# Patient Record
Sex: Female | Born: 1935 | Race: White | Hispanic: No | Smoking: Never smoker
Health system: Southern US, Community
[De-identification: ages and names within clinical notes are randomized; demographics above are authoritative.]

## PROBLEM LIST (undated history)

## (undated) HISTORY — PX: CLUB FOOT RELEASE: SHX1363

## (undated) HISTORY — PX: EYE SURGERY: SHX253

---

## 2015-02-24 ENCOUNTER — Encounter: Payer: Self-pay | Admitting: Student

## 2015-02-24 ENCOUNTER — Other Ambulatory Visit: Payer: Self-pay

## 2015-02-24 ENCOUNTER — Emergency Department
Admission: EM | Admit: 2015-02-24 | Discharge: 2015-02-24 | Disposition: A | Discharge status: Home / Self Care | Payer: Medicare Other | Attending: Emergency Medicine | Admitting: Emergency Medicine

## 2015-02-24 ENCOUNTER — Emergency Department: Payer: Medicare Other

## 2015-02-24 DIAGNOSIS — Y92009 Unspecified place in unspecified non-institutional (private) residence as the place of occurrence of the external cause: Secondary | ICD-10-CM | POA: Insufficient documentation

## 2015-02-24 DIAGNOSIS — S51802A Unspecified open wound of left forearm, initial encounter: Secondary | ICD-10-CM | POA: Insufficient documentation

## 2015-02-24 DIAGNOSIS — S0511XA Contusion of eyeball and orbital tissues, right eye, initial encounter: Secondary | ICD-10-CM | POA: Insufficient documentation

## 2015-02-24 DIAGNOSIS — Y998 Other external cause status: Secondary | ICD-10-CM | POA: Insufficient documentation

## 2015-02-24 DIAGNOSIS — R42 Dizziness and giddiness: Secondary | ICD-10-CM | POA: Diagnosis not present

## 2015-02-24 DIAGNOSIS — M21961 Unspecified acquired deformity of right lower leg: Secondary | ICD-10-CM | POA: Diagnosis not present

## 2015-02-24 DIAGNOSIS — Z23 Encounter for immunization: Secondary | ICD-10-CM | POA: Insufficient documentation

## 2015-02-24 DIAGNOSIS — W19XXXA Unspecified fall, initial encounter: Secondary | ICD-10-CM

## 2015-02-24 DIAGNOSIS — S51812A Laceration without foreign body of left forearm, initial encounter: Secondary | ICD-10-CM

## 2015-02-24 DIAGNOSIS — Y9389 Activity, other specified: Secondary | ICD-10-CM | POA: Diagnosis not present

## 2015-02-24 DIAGNOSIS — S8012XD Contusion of left lower leg, subsequent encounter: Secondary | ICD-10-CM | POA: Insufficient documentation

## 2015-02-24 DIAGNOSIS — W1839XA Other fall on same level, initial encounter: Secondary | ICD-10-CM | POA: Insufficient documentation

## 2015-02-24 DIAGNOSIS — R7989 Other specified abnormal findings of blood chemistry: Secondary | ICD-10-CM | POA: Insufficient documentation

## 2015-02-24 DIAGNOSIS — R778 Other specified abnormalities of plasma proteins: Secondary | ICD-10-CM

## 2015-02-24 DIAGNOSIS — M21962 Unspecified acquired deformity of left lower leg: Secondary | ICD-10-CM | POA: Diagnosis not present

## 2015-02-24 LAB — COMPREHENSIVE METABOLIC PANEL
ALBUMIN: 3.4 g/dL — AB (ref 3.5–5.0)
ALK PHOS: 105 U/L (ref 38–126)
ALT: 14 U/L (ref 14–54)
AST: 25 U/L (ref 15–41)
Anion gap: 7 (ref 5–15)
BUN: 16 mg/dL (ref 6–20)
CO2: 28 mmol/L (ref 22–32)
Calcium: 8.4 mg/dL — ABNORMAL LOW (ref 8.9–10.3)
Chloride: 105 mmol/L (ref 101–111)
Creatinine, Ser: 0.5 mg/dL (ref 0.44–1.00)
GFR calc non Af Amer: >60 mL/min (ref >60)
GLUCOSE: 96 mg/dL (ref 65–99)
Potassium: 5.1 mmol/L (ref 3.5–5.1)
Sodium: 140 mmol/L (ref 135–145)
TOTAL PROTEIN: 6.5 g/dL (ref 6.5–8.1)
Total Bilirubin: 0.8 mg/dL (ref 0.3–1.2)

## 2015-02-24 LAB — CBC
HCT: 39.1 % (ref 36.0–46.0)
Hemoglobin: 14.1 g/dL (ref 12.0–16.0)
MCH: 38.4 pg — AB (ref 26.0–34.0)
MCHC: 36.1 g/dL — AB (ref 32.0–36.0)
MCV: 106.6 fL — ABNORMAL HIGH (ref 80.0–100.0)
PLATELETS: 158 10*3/uL (ref 150–440)
RBC: 3.67 MIL/uL — ABNORMAL LOW (ref 3.80–5.20)
RDW: 14.4 % (ref 11.5–14.5)
WBC: 12.3 10*3/uL — AB (ref 3.6–11.0)

## 2015-02-24 LAB — TROPONIN I
TROPONIN I: 0.05 ng/mL — AB (ref <0.031)
TROPONIN I: 0.03 ng/mL (ref <0.031)

## 2015-02-24 MED ORDER — MUPIROCIN CALCIUM 2 % EX CREA: 1.0000 "application " | TOPICAL_CREAM | Freq: Two times a day (BID) | CUTANEOUS | Status: DC

## 2015-02-24 MED ORDER — TETANUS-DIPHTH-ACELL PERTUSSIS 5-2.5-18.5 LF-MCG/0.5 IM SUSP
0.5000 mL | Freq: Once | INTRAMUSCULAR | Status: AC
  Administered 2015-02-24: 0.5 mL via INTRAMUSCULAR
  Filled 2015-02-24 (×2): qty 0.5

## 2015-02-24 NOTE — ED Provider Notes (Signed)
[facility] Emergency Department Provider Note     Time seen: ----------------------------------------- 7:54 AM on 02/24/2015 -----------------------------------------    I have reviewed the triage vital signs and the nursing notes.   HISTORY  Chief Complaint Fall    HPI Haley Waters is a 81 y.o. female who presents the ER brought in by EMS after falling this morning at home. Patient states she got out of bed and felt dizzy and fell. She was going to go to the bathroom, has a skin tear noted to the left forearm bandaged by EMS. She has no abrasion noted to the right periorbital area and the bridge of the nose. Patient denies any complaints other than the forearm burning. Patient states she feels fine currently   No past medical history on file.  There are no active problems to display for this patient.   No past surgical history on file.  Allergies Review of patient's allergies indicates not on file.  Social History History  Substance Use Topics  . Smoking status: Not on file  . Smokeless tobacco: Not on file  . Alcohol Use: Not on file    Review of Systems Constitutional: Negative for fever. Eyes: Negative for visual changes. ENT: Negative for sore throat. Cardiovascular: Negative for chest pain. Respiratory: Negative for shortness of breath. Gastrointestinal: Negative for abdominal pain, vomiting and diarrhea. Genitourinary: Negative for dysuria. Musculoskeletal: Negative for back pain. Skin: Negative for rash. Neurological: Negative for headaches, focal weakness or numbness. Positive for dizziness  10-point ROS otherwise negative.  ____________________________________________   PHYSICAL EXAM:  VITAL SIGNS: ED Triage Vitals  Enc Vitals Group     BP --      Pulse --      Resp --      Temp --      Temp src --      SpO2 --      Weight --      Height --      Head Cir --      Peak Flow --      Pain Score --      Pain  Loc --      Pain Edu? --      Excl. in GC? --     Constitutional: Alert and oriented. Well appearing and in no distress. Eyes: Conjunctivae are normal. PERRL. Normal extraocular movements. ENT   Head: There is a right infraorbital contusion.   Nose: Mild abrasion across the nasal bridge.   Mouth/Throat: Mucous membranes are moist.   Neck: No stridor. Cardiovascular: Normal rate, regular rhythm. Normal and symmetric distal pulses are present in all extremities. No murmurs, rubs, or gallops. Respiratory: Normal respiratory effort without tachypnea nor retractions. Breath sounds are clear and equal bilaterally. No wheezes/rales/rhonchi. Gastrointestinal: Soft and nontender. No distention. No abdominal bruits.  Musculoskeletal: There are deformities noted in bilateral lower extremities, there is contusion over the distal tibia which she states is old. No joint effusions.  No lower extremity tenderness nor edema. Neurologic:  Normal speech and language. No gross focal neurologic deficits are appreciated. Speech is normal. No gait instability. Skin:  Skin is warm, dry and intact. No rash noted. Psychiatric: Mood and affect are normal. Speech and behavior are normal. Patient exhibits appropriate insight and judgment. ____________________________________________  EKG: Interpreted by me. Sinus tachycardia with a rate of 109 bpm, low voltage QRS, normal PR interval, normal QRS with, normal QT interval. Left axis deviation  ____________________________________________  ED COURSE:  Pertinent labs &  imaging results that were available during my care of the patient were reviewed by me and considered in my medical decision making (see chart for details). We'll check basic labs and reevaluate. ____________________________________________    LABS (pertinent positives/negatives)  Labs Reviewed  CBC - Abnormal; Notable for the following:    WBC 12.3 (*)    RBC 3.67 (*)    MCV 106.6 (*)     MCH 38.4 (*)    MCHC 36.1 (*)    All other components within normal limits  TROPONIN I - Abnormal; Notable for the following:    Troponin I 0.05 (*)    All other components within normal limits  COMPREHENSIVE METABOLIC PANEL - Abnormal; Notable for the following:    Calcium 8.4 (*)    Albumin 3.4 (*)    All other components within normal limits  TROPONIN I    RADIOLOGY  IMPRESSION: No acute findings. Atrophy present as well as advanced small vessel disease.   ____________________________________________  FINAL ASSESSMENT AND PLAN  Fall, dizziness, elevated troponin  Plan: Patient with labs and imaging as dictated above. Repeat troponin is unremarkable. I've advised family of this, she does several reasons for troponin elevation. This can be rechecked by her doctor. She is in no acute distress, wants to go home and eat lunch. She is stable for discharge.   Emily Filbert, MD   Emily Filbert, MD 02/24/15 [phone]

## 2015-02-24 NOTE — Discharge Instructions (Signed)
Dizziness °Dizziness is a common problem. It is a feeling of unsteadiness or light-headedness. You may feel like you are about to faint. Dizziness can lead to injury if you stumble or fall. A person of any age group can suffer from dizziness, but dizziness is more common in older adults. °CAUSES  °Dizziness can be caused by many different things, including: °· Middle ear problems. °· Standing for too long. °· Infections. °· An allergic reaction. °· Aging. °· An emotional response to something, such as the sight of blood. °· Side effects of medicines. °· Tiredness. °· Problems with circulation or blood pressure. °· Excessive use of alcohol or medicines, or illegal drug use. °· Breathing too fast (hyperventilation). °· An irregular heart rhythm (arrhythmia). °· A low red blood cell count (anemia). °· Pregnancy. °· Vomiting, diarrhea, fever, or other illnesses that cause body fluid loss (dehydration). °· Diseases or conditions such as Parkinson's disease, high blood pressure (hypertension), diabetes, and thyroid problems. °· Exposure to extreme heat. °DIAGNOSIS  °Your health care provider will ask about your symptoms, perform a physical exam, and perform an electrocardiogram (ECG) to record the electrical activity of your heart. Your health care provider may also perform other heart or blood tests to determine the cause of your dizziness. These may include: °· Transthoracic echocardiogram (TTE). During echocardiography, sound waves are used to evaluate how blood flows through your heart. °· Transesophageal echocardiogram (TEE). °· Cardiac monitoring. This allows your health care provider to monitor your heart rate and rhythm in real time. °· Holter monitor. This is a portable device that records your heartbeat and can help diagnose heart arrhythmias. It allows your health care provider to track your heart activity for several days if needed. °· Stress tests by exercise or by giving medicine that makes the heart beat  faster. °TREATMENT  °Treatment of dizziness depends on the cause of your symptoms and can vary greatly. °HOME CARE INSTRUCTIONS  °· Drink enough fluids to keep your urine clear or pale yellow. This is especially important in very hot weather. In older adults, it is also important in cold weather. °· Take your medicine exactly as directed if your dizziness is caused by medicines. When taking blood pressure medicines, it is especially important to get up slowly. °· Rise slowly from chairs and steady yourself until you feel okay. °· In the morning, first sit up on the side of the bed. When you feel okay, stand slowly while holding onto something until you know your balance is fine. °· Move your legs often if you need to stand in one place for a long time. Tighten and relax your muscles in your legs while standing. °· Have someone stay with you for 1-2 days if dizziness continues to be a problem. Do this until you feel you are well enough to stay alone. Have the person call your health care provider if he or she notices changes in you that are concerning. °· Do not drive or use heavy machinery if you feel dizzy. °· Do not drink alcohol. °SEEK IMMEDIATE MEDICAL CARE IF:  °· Your dizziness or light-headedness gets worse. °· You feel nauseous or vomit. °· You have problems talking, walking, or using your arms, hands, or legs. °· You feel weak. °· You are not thinking clearly or you have trouble forming sentences. It may take a friend or family member to notice this. °· You have chest pain, abdominal pain, shortness of breath, or sweating. °· Your vision changes. °· You notice   any bleeding. °· You have side effects from medicine that seems to be getting worse rather than better. °MAKE SURE YOU:  °· Understand these instructions. °· Will watch your condition. °· Will get help right away if you are not doing well or get worse. °Document Released: 12/31/2000 Document Revised: 07/12/2013 Document Reviewed: 01/24/2011 °ExitCare®  Patient Information ©2015 [employer]. This information is not intended to replace advice given to you by your health care provider. Make sure you discuss any questions you have with your health care provider. ° °Skin Tear Care °A skin tear is a wound in which the top layer of skin has peeled off. This is a common problem with aging because the skin becomes thinner and more fragile as a person gets older. In addition, some medicines, such as oral corticosteroids, can lead to skin thinning if taken for long periods of time.  °A skin tear is often repaired with tape or skin adhesive strips. This keeps the skin that has been peeled off in contact with the healthier skin beneath. Depending on the location of the wound, a bandage (dressing) may be applied over the tape or skin adhesive strips. Sometimes, during the healing process, the skin turns black and dies. Even when this happens, the torn skin acts as a good dressing until the skin underneath gets healthier and repairs itself. °HOME CARE INSTRUCTIONS  °· Change dressings once per day or as directed by your caregiver. °¨ Gently clean the skin tear and the area around the tear using saline solution or mild soap and water. °¨ Do not rub the injured skin dry. Let the area air dry. °¨ Apply petroleum jelly or an antibiotic cream or ointment to keep the tear moist. This will help the wound heal. Do not allow a scab to form. °¨ If the dressing sticks before the next dressing change, moisten it with warm soapy water and gently remove it. °· Protect the injured skin until it has healed. °· Only take over-the-counter or prescription medicines as directed by your caregiver. °· Take showers or baths using warm soapy water. Apply a new dressing after the shower or bath. °· Keep all follow-up appointments as directed by your caregiver.   °SEEK IMMEDIATE MEDICAL CARE IF:  °· You have redness, swelling, or increasing pain in the skin tear. °· You have pus coming from the skin  tear. °· You have chills. °· You have a red streak that goes away from the skin tear. °· You have a bad smell coming from the tear or dressing. °· You have a fever or persistent symptoms for more than 2-3 days. °· You have a fever and your symptoms suddenly get worse. °MAKE SURE YOU: °· Understand these instructions. °· Will watch this condition. °· Will get help right away if your child is not doing well or gets worse. °Document Released: 04/01/2001 Document Revised: 03/31/2012 Document Reviewed: 01/19/2012 °ExitCare® Patient Information ©2015 [employer]. This information is not intended to replace advice given to you by your health care provider. Make sure you discuss any questions you have with your health care provider. ° °

## 2015-02-24 NOTE — ED Notes (Signed)
Approximate 2" skin tear noted to left anterior forearm with small amount bloody drainage present. No s/s infection noted. Wet to dry saline dressing applied. Pt daughter at bedside. Pt resting comfortably, no apparent distress noted.

## 2015-02-24 NOTE — ED Notes (Signed)
Critical lab value reported to [facility]. MD

## 2015-02-24 NOTE — ED Notes (Addendum)
Pt BIB EMS after falling this morning at home. States she got out of bed and felt dizzy and fell. Pt has large skin tear to left arm, bandaged by EMS. Abrasions noted to right eye area and bridge of nose.

## 2016-04-02 ENCOUNTER — Inpatient Hospital Stay
Admission: EM | Admit: 2016-04-02 | Discharge: 2016-04-05 | DRG: 193 | Disposition: A | Discharge status: Skilled Nursing Facility | Payer: Medicare Other | Attending: Internal Medicine | Admitting: Internal Medicine

## 2016-04-02 ENCOUNTER — Emergency Department: Payer: Medicare Other

## 2016-04-02 ENCOUNTER — Encounter: Payer: Self-pay | Admitting: Intensive Care

## 2016-04-02 DIAGNOSIS — L8990 Pressure ulcer of unspecified site, unspecified stage: Secondary | ICD-10-CM | POA: Diagnosis present

## 2016-04-02 DIAGNOSIS — Z66 Do not resuscitate: Secondary | ICD-10-CM | POA: Diagnosis present

## 2016-04-02 DIAGNOSIS — W19XXXA Unspecified fall, initial encounter: Secondary | ICD-10-CM

## 2016-04-02 DIAGNOSIS — R778 Other specified abnormalities of plasma proteins: Secondary | ICD-10-CM

## 2016-04-02 DIAGNOSIS — R000 Tachycardia, unspecified: Secondary | ICD-10-CM | POA: Diagnosis not present

## 2016-04-02 DIAGNOSIS — R296 Repeated falls: Secondary | ICD-10-CM | POA: Diagnosis present

## 2016-04-02 DIAGNOSIS — I248 Other forms of acute ischemic heart disease: Secondary | ICD-10-CM | POA: Diagnosis present

## 2016-04-02 DIAGNOSIS — Z8249 Family history of ischemic heart disease and other diseases of the circulatory system: Secondary | ICD-10-CM | POA: Diagnosis not present

## 2016-04-02 DIAGNOSIS — IMO0002 Reserved for concepts with insufficient information to code with codable children: Secondary | ICD-10-CM

## 2016-04-02 DIAGNOSIS — J9601 Acute respiratory failure with hypoxia: Secondary | ICD-10-CM | POA: Diagnosis present

## 2016-04-02 DIAGNOSIS — Z23 Encounter for immunization: Secondary | ICD-10-CM | POA: Diagnosis not present

## 2016-04-02 DIAGNOSIS — Y92000 Kitchen of unspecified non-institutional (private) residence as  the place of occurrence of the external cause: Secondary | ICD-10-CM

## 2016-04-02 DIAGNOSIS — R7989 Other specified abnormal findings of blood chemistry: Secondary | ICD-10-CM

## 2016-04-02 DIAGNOSIS — J690 Pneumonitis due to inhalation of food and vomit: Secondary | ICD-10-CM

## 2016-04-02 DIAGNOSIS — W1830XA Fall on same level, unspecified, initial encounter: Secondary | ICD-10-CM | POA: Diagnosis present

## 2016-04-02 DIAGNOSIS — J189 Pneumonia, unspecified organism: Secondary | ICD-10-CM | POA: Diagnosis not present

## 2016-04-02 DIAGNOSIS — Q6689 Other  specified congenital deformities of feet: Secondary | ICD-10-CM | POA: Diagnosis not present

## 2016-04-02 DIAGNOSIS — S0083XA Contusion of other part of head, initial encounter: Secondary | ICD-10-CM | POA: Diagnosis present

## 2016-04-02 DIAGNOSIS — S0121XA Laceration without foreign body of nose, initial encounter: Secondary | ICD-10-CM | POA: Diagnosis present

## 2016-04-02 LAB — URINALYSIS COMPLETE WITH MICROSCOPIC (ARMC ONLY)
BACTERIA UA: NONE SEEN
BILIRUBIN URINE: NEGATIVE
GLUCOSE, UA: NEGATIVE mg/dL
Ketones, ur: NEGATIVE mg/dL
LEUKOCYTES UA: NEGATIVE
Nitrite: NEGATIVE
Protein, ur: NEGATIVE mg/dL
SPECIFIC GRAVITY, URINE: 1.032 — AB (ref 1.005–1.030)
pH: 7 (ref 5.0–8.0)

## 2016-04-02 LAB — CBC
HCT: 42 % (ref 35.0–47.0)
Hemoglobin: 14.6 g/dL (ref 12.0–16.0)
MCH: 32.2 pg (ref 26.0–34.0)
MCHC: 34.7 g/dL (ref 32.0–36.0)
MCV: 92.7 fL (ref 80.0–100.0)
PLATELETS: 205 10*3/uL (ref 150–440)
RBC: 4.53 MIL/uL (ref 3.80–5.20)
RDW: 14.2 % (ref 11.5–14.5)
WBC: 13.2 10*3/uL — AB (ref 3.6–11.0)

## 2016-04-02 LAB — BASIC METABOLIC PANEL
Anion gap: 8 (ref 5–15)
BUN: 21 mg/dL — AB (ref 6–20)
CALCIUM: 8.5 mg/dL — AB (ref 8.9–10.3)
CHLORIDE: 103 mmol/L (ref 101–111)
CO2: 29 mmol/L (ref 22–32)
CREATININE: 0.7 mg/dL (ref 0.44–1.00)
GFR calc Af Amer: >60 mL/min (ref >60)
GFR calc non Af Amer: >60 mL/min (ref >60)
Glucose, Bld: 137 mg/dL — ABNORMAL HIGH (ref 65–99)
Potassium: 4.7 mmol/L (ref 3.5–5.1)
SODIUM: 140 mmol/L (ref 135–145)

## 2016-04-02 LAB — TROPONIN I: TROPONIN I: 0.04 ng/mL — AB (ref <0.03)

## 2016-04-02 LAB — FIBRIN DERIVATIVES D-DIMER (ARMC ONLY): FIBRIN DERIVATIVES D-DIMER (ARMC): 2653 — AB (ref 0–499)

## 2016-04-02 MED ORDER — IOPAMIDOL (ISOVUE-370) INJECTION 76%
75.0000 mL | Freq: Once | INTRAVENOUS | Status: AC | PRN
  Administered 2016-04-02: 75 mL via INTRAVENOUS

## 2016-04-02 MED ORDER — AZITHROMYCIN 200 MG/5ML PO SUSR
250.0000 mg | Freq: Every day | ORAL | Status: DC
  Administered 2016-04-03 – 2016-04-04 (×2): 250 mg via ORAL
  Filled 2016-04-02 (×2): qty 10

## 2016-04-02 MED ORDER — ENOXAPARIN SODIUM 30 MG/0.3ML ~~LOC~~ SOLN
30.0000 mg | Freq: Every day | SUBCUTANEOUS | Status: DC
  Administered 2016-04-02 – 2016-04-04 (×3): 30 mg via SUBCUTANEOUS
  Filled 2016-04-02 (×3): qty 0.3

## 2016-04-02 MED ORDER — TETANUS-DIPHTH-ACELL PERTUSSIS 5-2.5-18.5 LF-MCG/0.5 IM SUSP
0.5000 mL | Freq: Once | INTRAMUSCULAR | Status: DC
  Filled 2016-04-02: qty 0.5

## 2016-04-02 MED ORDER — SODIUM CHLORIDE 0.9 % IV SOLN
Freq: Once | INTRAVENOUS | Status: AC
  Administered 2016-04-02: via INTRAVENOUS

## 2016-04-02 MED ORDER — DEXTROSE 5 % IV SOLN
500.0000 mg | Freq: Once | INTRAVENOUS | Status: AC
  Administered 2016-04-02: 500 mg via INTRAVENOUS
  Filled 2016-04-02: qty 500

## 2016-04-02 MED ORDER — SODIUM CHLORIDE 0.9 % IV BOLUS (SEPSIS)
500.0000 mL | Freq: Once | INTRAVENOUS | Status: AC
  Administered 2016-04-02: 500 mL via INTRAVENOUS

## 2016-04-02 MED ORDER — LIDOCAINE-EPINEPHRINE-TETRACAINE (LET) SOLUTION
3.0000 mL | Freq: Once | NASAL | Status: AC
  Administered 2016-04-02: 3 mL via TOPICAL
  Filled 2016-04-02: qty 3

## 2016-04-02 MED ORDER — ACETAMINOPHEN 325 MG PO TABS: 650.0000 mg | ORAL_TABLET | Freq: Four times a day (QID) | ORAL | Status: DC | PRN

## 2016-04-02 MED ORDER — INFLUENZA VAC SPLIT QUAD 0.5 ML IM SUSY
0.5000 mL | PREFILLED_SYRINGE | INTRAMUSCULAR | Status: AC
  Administered 2016-04-03: 0.5 mL via INTRAMUSCULAR
  Filled 2016-04-02: qty 0.5

## 2016-04-02 MED ORDER — SODIUM CHLORIDE 0.9% FLUSH
3.0000 mL | Freq: Two times a day (BID) | INTRAVENOUS | Status: DC
  Administered 2016-04-03: 3 mL via INTRAVENOUS

## 2016-04-02 MED ORDER — ONDANSETRON HCL 4 MG PO TABS: 4.0000 mg | ORAL_TABLET | Freq: Four times a day (QID) | ORAL | Status: DC | PRN

## 2016-04-02 MED ORDER — LIDOCAINE-EPINEPHRINE-TETRACAINE (LET) SOLUTION
NASAL | Status: AC
Start: 2016-04-02 — End: 2016-04-03
  Filled 2016-04-02: qty 3

## 2016-04-02 MED ORDER — DEXTROSE 5 % IV SOLN
1.0000 g | INTRAVENOUS | Status: DC
  Filled 2016-04-02: qty 10

## 2016-04-02 MED ORDER — ACETAMINOPHEN 650 MG RE SUPP: 650.0000 mg | Freq: Four times a day (QID) | RECTAL | Status: DC | PRN

## 2016-04-02 MED ORDER — ONDANSETRON HCL 4 MG/2ML IJ SOLN: 4.0000 mg | Freq: Four times a day (QID) | INTRAMUSCULAR | Status: DC | PRN

## 2016-04-02 MED ORDER — LIDOCAINE-EPINEPHRINE-TETRACAINE (LET) SOLUTION
3.0000 mL | Freq: Once | NASAL | Status: AC
  Administered 2016-04-02: 3 mL via TOPICAL

## 2016-04-02 MED ORDER — DEXTROSE 5 % IV SOLN
1.0000 g | Freq: Once | INTRAVENOUS | Status: AC
  Administered 2016-04-02: 1 g via INTRAVENOUS
  Filled 2016-04-02: qty 10

## 2016-04-02 NOTE — ED Notes (Signed)
Per RN Lurena Joinerebecca, provided pt with sandwich tray. Daughter at bedside and will assist pt with meal if needed.

## 2016-04-02 NOTE — ED Notes (Signed)
Pt fell in the kitchen - she turned around to fast and got dizzy and fell - pt fell on front of face and hit nose - Pt has laceration across bridge of nose - pt denies any difficulty breathing through nose

## 2016-04-02 NOTE — H&P (Signed)
[facility] - [place] [facility]   PATIENT NAME: Haley Waters    MR#:  [id number]  DATE OF BIRTH:  [birth year]  DATE OF ADMISSION:  04/02/2016  PRIMARY CARE PHYSICIAN: No PCP Per Patient   REQUESTING/REFERRING PHYSICIAN: Dr Roxan Hockey  CHIEF COMPLAINT:  Accidental fall off the dizziness today at home  HISTORY OF PRESENT ILLNESS:  Haley Waters  is a 82 y.o. female with a known history of Club foot status post surgery comes to the emergency room after she had a mechanical fall in the kitchen due to [place] spell she got after she turned around very fast. Patient face down on the floor she had a laceration over her nasal bridge which has been sutured in the emergency room no other trauma reported CT of the head otherwise is negative. -Patient was found to be tachycardic and hypoxic with sats 88-89% for the workup showed she has right lower lobe pneumonia versus atelectasis She received IV Rocephin and Cipro makes being admitted for community-acquired pneumonia.   PAST MEDICAL HISTORY:  History reviewed. No pertinent past medical history.  PAST SURGICAL HISTOIRY:   Past Surgical History:  Procedure Laterality Date  . CLUB FOOT RELEASE Bilateral   . EYE SURGERY      SOCIAL HISTORY:   Social History  Substance Use Topics  . Smoking status: Never Smoker  . Smokeless tobacco: Never Used  . Alcohol use No    FAMILY HISTORY:  Hypertension  DRUG ALLERGIES:  No Known Allergies  REVIEW OF SYSTEMS:  Review of Systems  Constitutional: Negative for chills, fever and weight loss.  HENT: Negative for ear discharge, ear pain and nosebleeds.   Eyes: Negative for blurred vision, pain and discharge.  Respiratory: Positive for shortness of breath. Negative for sputum production, wheezing and stridor.   Cardiovascular: Negative for chest pain, palpitations, orthopnea and PND.  Gastrointestinal: Negative for abdominal pain, diarrhea, nausea and vomiting.   Genitourinary: Negative for frequency and urgency.  Musculoskeletal: Negative for back pain and joint pain.  Neurological: Positive for weakness. Negative for sensory change, speech change and focal weakness.  Psychiatric/Behavioral: Negative for depression and hallucinations. The patient is not nervous/anxious.      MEDICATIONS AT HOME:   Prior to Admission medications   Not on File      VITAL SIGNS:  Blood pressure 133/68, pulse (!) 105, temperature 98.3 F (36.8 C), temperature source Oral, resp. rate (!) 25, height 4\' 6"  (1.372 m), weight 38.6 kg (85 lb), SpO2 92 %.  PHYSICAL EXAMINATION:  GENERAL:  82 y.o.-year-old patient lying in the bed with no acute distress.  EYES: Pupils equal, round, reactive to light and accommodation. No scleral icterus. Extraocular muscles intact.  HEENT: Head atraumatic, normocephalic. Oropharynx and nasopharynx clear. Laceration status post suturing nose bridge NECK:  Supple, no jugular venous distention. No thyroid enlargement, no tenderness.  LUNGS: Normal breath sounds bilaterally, no wheezing, rales,rhonchi or crepitation. No use of accessory muscles of respiration. Severe scoliosis CARDIOVASCULAR: S1, S2 normal. No murmurs, rubs, or gallops. tachycardia ABDOMEN: Soft, nontender, nondistended. Bowel sounds present. No organomegaly or mass.  EXTREMITIES: No pedal edema, cyanosis, or clubbing. Club feet mild puffiness NEUROLOGIC: Cranial nerves II through XII are intact. Muscle strength 5/5 in all extremities. Sensation intact. Gait not checked.  PSYCHIATRIC: The patient is alert and oriented. Heartburn hearing SKIN: No obvious rash, lesion, or ulcer.   LABORATORY PANEL:   CBC  Recent Labs Lab 04/02/16 1429  WBC 13.2*  HGB  14.6  HCT 42.0  PLT 205   ------------------------------------------------------------------------------------------------------------------  Chemistries   Recent Labs Lab 04/02/16 1429  NA 140  K 4.7  CL 103   CO2 29  GLUCOSE 137*  BUN 21*  CREATININE 0.70  CALCIUM 8.5*   ------------------------------------------------------------------------------------------------------------------  Cardiac Enzymes  Recent Labs Lab 04/02/16 1434  TROPONINI 0.04*   ------------------------------------------------------------------------------------------------------------------  RADIOLOGY:  Dg Chest 2 View  Result Date: 04/02/2016 CLINICAL DATA:  Larey SeatFell in kitchen, turned too fast, got dizzy and fell landing on face, struck nose, laceration across bridge of nose, hypoxia, question pneumonia or atelectasis EXAM: CHEST  2 VIEW COMPARISON:  Non FINDINGS: Marked thoracic deformity secondary to marked levoconvex thoracic scoliosis. Enlargement of cardiac silhouette. Mediastinal contours and pulmonary vascularity normal for the degree of scoliosis. RIGHT basilar atelectasis and small RIGHT pleural effusion. Remaining lungs grossly clear. No definite pneumothorax. Marked osseous demineralization without definite fractures. IMPRESSION: Marked thoracic deformity secondary to pronounced levoconvex thoracic scoliosis. Small RIGHT pleural effusion and RIGHT basilar atelectasis. Enlargement of cardiac silhouette. Note definite acute bony abnormalities. Electronically Signed   By: Ulyses SouthwardMark  Boles M.D.   On: 04/02/2016 15:58   Ct Head Wo Contrast  Result Date: 04/02/2016 CLINICAL DATA:  Facial trauma.  Periorbital swelling.  Fall. EXAM: CT HEAD WITHOUT CONTRAST CT MAXILLOFACIAL WITHOUT CONTRAST TECHNIQUE: Multidetector CT imaging of the head and maxillofacial structures were performed using the standard protocol without intravenous contrast. Multiplanar CT image reconstructions of the maxillofacial structures were also generated. COMPARISON:  None. FINDINGS: CT HEAD FINDINGS Brain: There is atrophy and chronic small vessel disease changes. No acute intracranial abnormality. Specifically, no hemorrhage, hydrocephalus, mass lesion,  acute infarction, or significant intracranial injury. Vascular: No hyperdense vessel or unexpected calcification. Skull: No acute calvarial abnormality. Sinuses/Orbits: Visualized paranasal sinuses are clear. Fluid noted in the right mastoid air cells. Left mastoid air cells clear. Orbital soft tissues unremarkable. Other: None CT MAXILLOFACIAL FINDINGS Osseous: No fracture or mandibular dislocation. No destructive process. Orbits: Negative. No traumatic or inflammatory finding. Sinuses: Clear. Soft tissues: Soft tissue swelling in the region of the nose and nasal bridge. IMPRESSION: No acute intracranial abnormality. Atrophy, chronic small vessel disease. No facial fracture. Electronically Signed   By: Charlett NoseKevin  Dover M.D.   On: 04/02/2016 16:14   Ct Angio Chest Pe W And/or Wo Contrast  Result Date: 04/02/2016 CLINICAL DATA:  Hypoxia, concern for pulmonary embolism. Elevated D-dimer. Falling kitchen. EXAM: CT ANGIOGRAPHY CHEST WITH CONTRAST TECHNIQUE: Multidetector CT imaging of the chest was performed using the standard protocol during bolus administration of intravenous contrast. Multiplanar CT image reconstructions and MIPs were obtained to evaluate the vascular anatomy. CONTRAST:  Study 5 mL Isovue 370. COMPARISON:  Radiograph 04/02/2016 FINDINGS: Cardiovascular: No filling defects within the pulmonary arteries to suggest acute pulmonary embolism. Dilatation of the RIGHT pulmonary artery to 28 mm. No acute findings aorta great vessels. Calcification of the aortic arch. No pericardial fluid. Mediastinum/Nodes: No axillary or supraclavicular lymphadenopathy. No mediastinal hilar lymphadenopathy. No pericardial fluid esophagus normal. Lungs/Pleura: 5 mm nodule in the RIGHT upper lobe (image 40, series 6). Bilobed nodule in the RIGHT lower lobe measures 11 mm by 19 mm. This is smoothly marginated (image 50, series 6). Consolidation within the RIGHT lower lobe versus atelectasis. Small nodule in the LEFT upper lobe  measures 3 mm on image 23, series 6. Upper Abdomen: Limited view of the liver, kidneys, pancreas are unremarkable. Normal adrenal glands. Musculoskeletal: There is severe rotatory scoliosis of the spine which distorts the thorax  and upper abdomen. No acute or aggressive osseous lesion. Review of the MIP images confirms the above findings. IMPRESSION: 1. No evidence of acute pulmonary embolism. 2. Infiltrate versus atelectasis in the RIGHT lower lobe. 3. Dilatation of the pulmonary arteries could represent pulmonary hypertension. 4. Bilobed 2 cm nodule within the RIGHT lower lobe and smaller upper lobe nodules. These are smoothly marginated and are favored benign lung nodules. Consider follow-up CT in 3 months. More aggressive approach could be an FDG PET CT scan. 5. Severe rotatory scoliosis.  No evidence of fracture. Electronically Signed   By: Genevive Bi M.D.   On: 04/02/2016 17:09   Ct Maxillofacial Wo Contrast  Result Date: 04/02/2016 CLINICAL DATA:  Facial trauma.  Periorbital swelling.  Fall. EXAM: CT HEAD WITHOUT CONTRAST CT MAXILLOFACIAL WITHOUT CONTRAST TECHNIQUE: Multidetector CT imaging of the head and maxillofacial structures were performed using the standard protocol without intravenous contrast. Multiplanar CT image reconstructions of the maxillofacial structures were also generated. COMPARISON:  None. FINDINGS: CT HEAD FINDINGS Brain: There is atrophy and chronic small vessel disease changes. No acute intracranial abnormality. Specifically, no hemorrhage, hydrocephalus, mass lesion, acute infarction, or significant intracranial injury. Vascular: No hyperdense vessel or unexpected calcification. Skull: No acute calvarial abnormality. Sinuses/Orbits: Visualized paranasal sinuses are clear. Fluid noted in the right mastoid air cells. Left mastoid air cells clear. Orbital soft tissues unremarkable. Other: None CT MAXILLOFACIAL FINDINGS Osseous: No fracture or mandibular dislocation. No  destructive process. Orbits: Negative. No traumatic or inflammatory finding. Sinuses: Clear. Soft tissues: Soft tissue swelling in the region of the nose and nasal bridge. IMPRESSION: No acute intracranial abnormality. Atrophy, chronic small vessel disease. No facial fracture. Electronically Signed   By: Charlett Nose M.D.   On: 04/02/2016 16:14    EKG:  s tachycardia  IMPRESSION AND PLAN:   Haley Waters  is a 82 y.o. female with a known history of Club foot status post surgery comes to the emergency room after she had a mechanical fall in the kitchen due to [place] spell she got after she turned around very fast. Patient face down on the floor she had a laceration over her nasal bridge which has been sutured in the emergency room no other trauma reported CT of the head otherwise is negative.  1.mechanical fall status post dizziness at home -Patient had laceration of the nasal bridge status post suturing -CT head negative for any other trauma  2. Right lower lobe pneumonia -Patient was tachycardic and sats dropped in the upper 80s -Continue IV Rocephin and liquid Zithromax -Follow-up white count, blood cultures -Nebs as needed -Patient's d-dimer was elevated CT chest negative for PE  3. DVT prophylaxis subcutaneous Lovenox   All the records are reviewed and case discussed with ED provider. Management plans discussed with the patient, family and they are in agreement.  CODE STATUS: DR (d/w dter who is POA)  TOTAL TIME TAKING CARE OF THIS PATIENT: * .    Haley Waters M.D on 04/02/2016 at 7:43 PM  Between 7am to 6pm - Pager - [phone]  After 6pm go to [email or web address] - password [email or web address] [facility]  [facility]  Office  [phone]  CC: Primary care physician; No PCP Per Patient

## 2016-04-02 NOTE — Progress Notes (Signed)
Admisision done over phone with pt's daughter Dondra SpryGail. She is pt's POA

## 2016-04-02 NOTE — ED Notes (Signed)
troponin 0.04 called from from lab Haley Waters(jamie) MD notified

## 2016-04-02 NOTE — ED Notes (Signed)
Per RN Lurena Joinerebecca, bathed pt with CHG wipes and cleaned blood off of upper extremities and hair with washcloths. Changed patient's gown and linens.

## 2016-04-02 NOTE — ED Notes (Signed)
Pt and family are aware that a urine sample is needed

## 2016-04-02 NOTE — ED Triage Notes (Signed)
Patient presents to ER by EMS for fall at home. PAtient lives with daughter and her boyfriend. Boyfriend heard a thud and found patient on the ground. Pt reports she spun around too quickly while ambulating and felt dizzy and fell, hitting nose on the floor. Patient has deep laceration on nose with bloody drainage noted and brusing noted to L forehead. Pt denies feeling dizzy at this time. A&O x4

## 2016-04-02 NOTE — ED Provider Notes (Signed)
[facility] Emergency Department Provider Note    First MD Initiated Contact with Patient 04/02/16 1453     (approximate)  I have reviewed the triage vital signs and the nursing notes.   HISTORY  Chief Complaint No chief complaint on file.    HPI Corie ChiquitoMarion Brier is a 82 y.o. female  who presents with fall from standing with the facial trauma that occurred this afternoon after lunch. Patient was reportedly in the kitchen. She is able to ambulate without any assistive devices. States that she felt dizzy after turning away from the counter and fell and lost her balance.  Hit her nose against the floor. Had a laceration and bleeding. Denies any LOC. Denies any shortness of breath or chest pain. Denies any lower extremity swelling. Denies any recent fevers. Denies any history of lung disease. She is a not a smoker. Is not on any anticoagulation. Denies any history of heart disease. She currently lives at home with her daughter and her daughter's boyfriend.   History reviewed. No pertinent past medical history.  There are no active problems to display for this patient.   Past Surgical History:  Procedure Laterality Date  . CLUB FOOT RELEASE Bilateral   . EYE SURGERY      Prior to Admission medications   Not on File    Allergies Review of patient's allergies indicates no known allergies.  History reviewed. No pertinent family history.  Social History Social History  Substance Use Topics  . Smoking status: Never Smoker  . Smokeless tobacco: Never Used  . Alcohol use No    Review of Systems Patient denies headaches, rhinorrhea, blurry vision, numbness, shortness of breath, chest pain, edema, cough, abdominal pain, nausea, vomiting, diarrhea, dysuria, fevers, rashes or hallucinations unless otherwise stated above in HPI. ____________________________________________   PHYSICAL EXAM:  VITAL SIGNS: Vitals:   04/02/16 1630 04/02/16 1718  BP:  115/71 133/68  Pulse: (!) 102 (!) 105  Resp: 20 (!) 25  Temp:      Constitutional: Elderly frail female in no acute distress Eyes: Conjunctivae are normal. PERRL. EOMI. Head: Stellate 2 cm laceration over the bridge of her nose. Nose: No congestion/rhinnorhea. Septal hematoma or deviation, midface is stable Mouth/Throat: Mucous membranes are moist.  Oropharynx non-erythematous. Neck: No stridor. Painless ROM. No cervical spine tenderness to palpation Hematological/Lymphatic/Immunilogical: No cervical lymphadenopathy. Cardiovascular: Mildly tachycardic regular rhythm. Grossly normal heart sounds.  Good peripheral circulation. Respiratory: Normal respiratory effort.  No retractions. Lungs CTAB. Gastrointestinal: Soft and nontender. No distention. No abdominal bruits. No CVA tenderness. Genitourinary:  Musculoskeletal: Severe scoliosis. No CT or L spine tenderness to palpation. No tenderness to palpation of any of the 4 extremities. Neurologic:  Normal speech and language. No gross focal neurologic deficits are appreciated. No gait instability. Skin:  Skin is warm, dry and intact. No rash noted.  ____________________________________________   LABS (all labs ordered are listed, but only abnormal results are displayed)  Results for orders placed or performed during the hospital encounter of 04/02/16 (from the past 24 hour(s))  Fibrin derivatives D-Dimer ([facility] only)     Status: Abnormal   Collection Time: 04/02/16  2:00 PM  Result Value Ref Range   Fibrin derivatives D-dimer [facility]) 2,653 (H) 0 - 499  Basic metabolic panel     Status: Abnormal   Collection Time: 04/02/16  2:29 PM  Result Value Ref Range   Sodium 140 135 - 145 mmol/L   Potassium 4.7 3.5 - 5.1 mmol/L  Chloride 103 101 - 111 mmol/L   CO2 29 22 - 32 mmol/L   Glucose, Bld 137 (H) 65 - 99 mg/dL   BUN 21 (H) 6 - 20 mg/dL   Creatinine, Ser 1.61 0.44 - 1.00 mg/dL   Calcium 8.5 (L) 8.9 - 10.3 mg/dL   GFR calc non Af Amer  >60 >60 mL/min   GFR calc Af Amer >60 >60 mL/min   Anion gap 8 5 - 15  CBC     Status: Abnormal   Collection Time: 04/02/16  2:29 PM  Result Value Ref Range   WBC 13.2 (H) 3.6 - 11.0 K/uL   RBC 4.53 3.80 - 5.20 MIL/uL   Hemoglobin 14.6 12.0 - 16.0 g/dL   HCT 09.6 04.5 - 40.9 %   MCV 92.7 80.0 - 100.0 fL   MCH 32.2 26.0 - 34.0 pg   MCHC 34.7 32.0 - 36.0 g/dL   RDW 81.1 91.4 - 78.2 %   Platelets 205 150 - 440 K/uL  Troponin I     Status: Abnormal   Collection Time: 04/02/16  2:34 PM  Result Value Ref Range   Troponin I 0.04 (HH) <0.03 ng/mL  Urinalysis complete, with microscopic ([facility] only)     Status: Abnormal   Collection Time: 04/02/16  6:24 PM  Result Value Ref Range   Color, Urine STRAW (A) YELLOW   APPearance CLEAR (A) CLEAR   Glucose, UA NEGATIVE NEGATIVE mg/dL   Bilirubin Urine NEGATIVE NEGATIVE   Ketones, ur NEGATIVE NEGATIVE mg/dL   Specific Gravity, Urine 1.032 (H) 1.005 - 1.030   Hgb urine dipstick 3+ (A) NEGATIVE   pH 7.0 5.0 - 8.0   Protein, ur NEGATIVE NEGATIVE mg/dL   Nitrite NEGATIVE NEGATIVE   Leukocytes, UA NEGATIVE NEGATIVE   RBC / HPF 0-5 0 - 5 RBC/hpf   WBC, UA 0-5 0 - 5 WBC/hpf   Bacteria, UA NONE SEEN NONE SEEN   Squamous Epithelial / LPF 0-5 (A) NONE SEEN   Mucous PRESENT    ____________________________________________  EKG My review and personal interpretation at Time: 14:22   Indication: tachycardia  Rate: 130  Rhythm: sinus Axis: left Other: non specific ST changes. ____________________________________________  RADIOLOGY  See chart for details ____________________________________________   PROCEDURES  Procedure(s) performed:  Marland KitchenMarland KitchenLaceration Repair Date/Time: 04/02/2016 5:50 PM Performed by: Willy Eddy Authorized by: Willy Eddy   Consent:    Consent obtained:  Verbal   Consent given by:  Patient Anesthesia (see MAR for exact dosages):    Anesthesia method:  Topical application   Topical anesthetic:  LET Laceration  details:    Location:  Face   Face location:  Nose   Length (cm):  3   Depth (mm):  3 Repair type:    Repair type:  Simple Pre-procedure details:    Preparation:  Patient was prepped and draped in usual sterile fashion Exploration:    Wound exploration: entire depth of wound probed and visualized   Treatment:    Area cleansed with:  Hibiclens and saline   Amount of cleaning:  Standard   Irrigation solution:  Sterile saline   Irrigation method:  Pressure wash   Visualized foreign bodies/material removed: no   Skin repair:    Repair method:  Sutures   Suture size:  5-0   Suture material:  Nylon   Suture technique:  Simple interrupted   Number of sutures:  5 Approximation:    Approximation:  Close   Vermilion border: well-aligned  Post-procedure details:    Dressing:  Antibiotic ointment   Patient tolerance of procedure:  Tolerated well, no immediate complications      Critical Care performed: yes CRITICAL CARE Performed by: Willy Eddy   Total critical care time: 30 minutes  Critical care time was exclusive of separately billable procedures and treating other patients.  Critical care was necessary to treat or prevent imminent or life-threatening deterioration.  Critical care was time spent personally by me on the following activities: development of treatment plan with patient and/or surrogate as well as nursing, discussions with consultants, evaluation of patient's response to treatment, examination of patient, obtaining history from patient or surrogate, ordering and performing treatments and interventions, ordering and review of laboratory studies, ordering and review of radiographic studies, pulse oximetry and re-evaluation of patient's condition.  ____________________________________________   INITIAL IMPRESSION / ASSESSMENT AND PLAN / ED COURSE  Pertinent labs & imaging results that were available during my care of the patient were reviewed by me and  considered in my medical decision making (see chart for details).  DDX: vertigo, sah, sdh, iph, fracture, laceration, acs, dysrhythmia, copd, chf  Tonnie Stillman is a 82 y.o. who presents to the ED with fall from standing with facial injury as described above. Patient arrives afebrile but hypoxic on room air and tachycardic. No previous history. Denies any chest pain or shortness of breath. Initial workup does show elevated troponin the patient has no evidence of acute ischemia. Chest x-ray with no significant infiltrate though limited due to severe scoliosis. Will order d-dimer to further risk stratify for PE. We'll give IV fluids for her tachycardia. CT imaging of the head and face will be ordered to evaluate for acute traumatic injury.  The patient will be placed on continuous pulse oximetry and telemetry for monitoring.  Laboratory evaluation will be sent to evaluate for the above complaints.     Clinical Course  Value Comment By Time  WBC: (!) 13.2 (Reviewed) Willy Eddy, MD 09/13 1536  BUN: (!) 21 (Reviewed) Willy Eddy, MD 09/13 1537  Troponin I: (!!) 0.04 (Reviewed) Willy Eddy, MD 09/13 1601   D-dimer is markedly elevated. Based on her tachycardia, hypoxia and syncopal about will order CTA chest to evaluate for pulmonary embolism. Willy Eddy, MD 09/13 1606   Patient reassessed CT chest does not show any evidence of PE but probable pneumonia. Laceration repaired without complication. Patient still with acute hypoxia requiring supplemental oxygen. He should unable to be weaned from oxygen. Will speak with hospitalist regarding admission for further evaluation and management. Willy Eddy, MD 09/13 1746     ____________________________________________   FINAL CLINICAL IMPRESSION(S) / ED DIAGNOSES  Final diagnoses:  Acute respiratory failure with hypoxia (HCC)  Laceration  Fall from standing, initial encounter  Troponin level elevated  Tachycardia    Aspiration pneumonia of right lower lobe, unspecified aspiration pneumonia type (HCC)      NEW MEDICATIONS STARTED DURING THIS VISIT:  New Prescriptions   No medications on file     Note:  This document was prepared using Dragon voice recognition software and may include unintentional dictation errors.    Willy Eddy, MD 04/02/16 [phone]

## 2016-04-02 NOTE — ED Notes (Signed)
Pt's daughter stated that patient had the tdap shot within the last 2 years

## 2016-04-03 DIAGNOSIS — L8990 Pressure ulcer of unspecified site, unspecified stage: Secondary | ICD-10-CM | POA: Insufficient documentation

## 2016-04-03 LAB — BLOOD CULTURE ID PANEL (REFLEXED)
ACINETOBACTER BAUMANNII: NOT DETECTED
CANDIDA ALBICANS: NOT DETECTED
CANDIDA GLABRATA: NOT DETECTED
CANDIDA KRUSEI: NOT DETECTED
Candida parapsilosis: NOT DETECTED
Candida tropicalis: NOT DETECTED
Carbapenem resistance: NOT DETECTED
ENTEROBACTER CLOACAE COMPLEX: NOT DETECTED
ESCHERICHIA COLI: NOT DETECTED
Enterobacteriaceae species: NOT DETECTED
Enterococcus species: NOT DETECTED
HAEMOPHILUS INFLUENZAE: NOT DETECTED
KLEBSIELLA OXYTOCA: NOT DETECTED
Klebsiella pneumoniae: NOT DETECTED
LISTERIA MONOCYTOGENES: NOT DETECTED
METHICILLIN RESISTANCE: NOT DETECTED
NEISSERIA MENINGITIDIS: NOT DETECTED
Proteus species: NOT DETECTED
Pseudomonas aeruginosa: NOT DETECTED
SERRATIA MARCESCENS: NOT DETECTED
STREPTOCOCCUS PNEUMONIAE: NOT DETECTED
STREPTOCOCCUS PYOGENES: NOT DETECTED
STREPTOCOCCUS SPECIES: NOT DETECTED
Staphylococcus aureus (BCID): NOT DETECTED
Staphylococcus species: DETECTED — AB
Streptococcus agalactiae: NOT DETECTED
Vancomycin resistance: NOT DETECTED

## 2016-04-03 LAB — MAGNESIUM: Magnesium: 2.1 mg/dL (ref 1.7–2.4)

## 2016-04-03 MED ORDER — CEFUROXIME AXETIL 500 MG PO TABS: 500.0000 mg | ORAL_TABLET | Freq: Two times a day (BID) | ORAL | Status: DC

## 2016-04-03 MED ORDER — CEFUROXIME AXETIL 500 MG PO TABS
500.0000 mg | ORAL_TABLET | Freq: Every day | ORAL | Status: DC
  Administered 2016-04-03 – 2016-04-05 (×3): 500 mg via ORAL
  Filled 2016-04-03 (×3): qty 1

## 2016-04-03 NOTE — Progress Notes (Signed)
PHARMACY - PHYSICIAN COMMUNICATION CRITICAL VALUE ALERT - BLOOD CULTURE IDENTIFICATION (BCID)  No results found for this or any previous visit.   9/14:  Staph A growing in Aerobic bottle , Mech A not detected.   Name of physician (or Provider) Contacted: Dr Emmit PomfretHugelmeyer  Changes to prescribed antibiotics required: No, Pt already on Ceftin 500 mg PO daily.  Will continue this pt on current ABx regimen.   Haley Waters D 04/03/2016  10:15 PM

## 2016-04-03 NOTE — Evaluation (Signed)
Physical Therapy Evaluation Patient Details Name: Haley Waters MRN: [id number] DOB: 05/24/1917 Today's Date: 04/03/2016   History of Present Illness  Haley Waters is a 82yo white female who [facility] after a mechanical fall at home onto her face (negative for fracture). She lives with her daughter and daughter's boyfriend; pt uses a SPC at baseline. 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 She is supected to have PNA and currently on O2 with tachycardia.   Clinical Impression  Upon entry, the patient is received semirecumbent in bed, no family/caregiver present. The pt is awake and agreeable to participate. No acute distress noted at this time. The pt is alert and oriented x3, pleasant, conversational, and following simple and multi-step commands consistently, but I suspect has some level of HOH as well as mild-moderate anomia, as she sticks to mostly 'yes', 'no' responses and has difficulty finding other words. Pt received on 2L O2, however moved to 3L for remainder of evaluation due to sustained desaturation and tachycardia; noted saturation of >88%, whereas pt is not on O2 at baseline at home.  Functional mobility assessment demonstrates mild-moderate weakness, the pt now requiring max assist physical assistance for bed mobility and transfers, whereas the patient reportedly performs these independently at baseline (caregiver confirmation needed). The patient is at high risk for falls as evidence by gait speed <1.8183m/s, forward reach <5", and multiple falls at home. Mobility assessment is ended early due to uncontrolled vitals.      Patient presenting with impairment of strength, balance, and activity tolerance, limiting ability to perform ADL and mobility tasks at  baseline level of function. Patient will benefit from skilled intervention to address the above impairments and limitations, in order to restore to prior level of function, improve patient  safety upon discharge, and to decrease falls risk.       Follow Up Recommendations SNF    Equipment Recommendations   (pediatric RW )    Recommendations for Other Services       Precautions / Restrictions Precautions Precautions: None Precaution Comments: multiple falls at home Restrictions Weight Bearing Restrictions: No      Mobility  Bed Mobility Overal bed mobility: Needs Assistance Bed Mobility: Supine to Sit;Sit to Supine     Supine to sit: Max assist Sit to supine: Max assist      Transfers Overall transfer level: Needs assistance Equipment used:  (ped RW) Transfers: Sit to/from Stand           General transfer comment: Pt is very short and bed/chair level comes to estimated T6 level or higher on her, requring special accomodation/assistance for into bed.   Ambulation/Gait Ambulation/Gait assistance: Min guard Ambulation Distance (Feet): 25 Feet Assistive device:  (ped RW )     Gait velocity interpretation: <1.8 ft/sec, indicative of risk for recurrent falls General Gait Details: slow and steady; mod difficulty managing RW; SaO2 desat on 2L (88%) and Tachycardia (120bpm).  No additional activity attempted until better controled.   Stairs            Wheelchair Mobility    Modified Rankin (Stroke Patients Only)       Balance Overall balance assessment: No apparent balance deficits (not formally assessed);History of Falls (Daughter reports multiple falls at home. )  Pertinent Vitals/Pain Pain Assessment: No/denies pain    Home Living Family/patient expects to be discharged to:: Private residence Living Arrangements: Children Available Help at Discharge: Family           Home Equipment: Gilmer Mor - single point      Prior Function           Comments: Pt reports to be indep at baseline with basic ADL.      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 Reports to have clubbed feet.)      Cervical / Trunk Assessment:  (Moderately weak trunk strength. )  Communication   Communication: HOH;Expressive difficulties (anomia and slowed speech. )  Cognition Arousal/Alertness: Awake/alert Behavior During Therapy: WFL for tasks assessed/performed Overall Cognitive Status: No family/caregiver present to determine baseline cognitive functioning (Oriented to person, place, and year; but insists that she is 82 years old. )                      General Comments      Exercises        Assessment/Plan    PT Assessment Patient needs continued PT services  PT Diagnosis Difficulty walking;Generalized weakness   PT Problem List Decreased strength;Decreased cognition;Decreased activity tolerance;Decreased range of motion;Decreased balance;Decreased mobility  PT Treatment Interventions Gait training;DME instruction;Functional mobility training;Therapeutic activities;Therapeutic exercise;Balance training;Patient/family education   PT Goals (Current goals can be found in the Care Plan section) Acute Rehab PT Goals PT Goal Formulation: Patient unable to participate in goal setting Time For Goal Achievement: 04/17/16    Frequency Min 2X/week   Barriers to discharge Inaccessible home environment      Co-evaluation               End of Session Equipment Utilized During Treatment: Gait belt;Oxygen Activity Tolerance: Patient limited by fatigue Patient left: in bed;with call bell/phone within reach;with bed alarm set Nurse Communication: Other (comment) (O2, gown, walker, Xfers)         Time: 4098-1191 PT Time Calculation (min) (ACUTE ONLY): 20 min   Charges:   PT Evaluation $PT Eval  Moderate Complexity: 1 Procedure PT Treatments $Therapeutic Activity: 8-22 mins   PT G Codes:        3:30 PM, 04/15/2016 Rosamaria Lints, PT, DPT Physical Therapist - [place] [phone] (mobile)

## 2016-04-03 NOTE — Progress Notes (Signed)
Central monitoring called to report pt had a 5 beat run of SVT. MD paged

## 2016-04-03 NOTE — Progress Notes (Signed)
Dr Tobi BastosPyreddy notified of SVT 5 beat run. Received new order to add magnesium to am labs

## 2016-04-03 NOTE — Progress Notes (Signed)
[facility]   PATIENT NAME: Haley Waters    MR#:  [id number]  DATE OF BIRTH:  08/24/1916  SUBJECTIVE:  CHIEF COMPLAINT:  No chief complaint on file.  - admitted after a fall- but noted to be hypoxic and has pneumonia - facial bruising noted from fall. No complaints - still needing 2L o2  REVIEW OF SYSTEMS:  Review of Systems  Constitutional: Positive for malaise/fatigue. Negative for chills and fever.  HENT: Negative for ear discharge, ear pain and nosebleeds.   Respiratory: Negative for cough, shortness of breath and wheezing.   Cardiovascular: Negative for chest pain, palpitations and leg swelling.  Gastrointestinal: Negative for abdominal pain, constipation, diarrhea, nausea and vomiting.  Genitourinary: Negative for dysuria and urgency.  Musculoskeletal: Positive for myalgias.  Neurological: Negative for dizziness, tingling, seizures and headaches.  Psychiatric/Behavioral: Negative for depression.    DRUG ALLERGIES:  No Known Allergies  VITALS:  Blood pressure (!) 139/51, pulse 95, temperature 98.4 F (36.9 C), temperature source Oral, resp. rate (!) 24, height 4\' 6"  (1.372 m), weight 38.6 kg (85 lb), SpO2 (!) 88 %.  PHYSICAL EXAMINATION:  Physical Exam  GENERAL:  82 y.o.-year-old patient lying in the bed with no acute distress. She has achondroplasia. EYES: Pupils equal, round, reactive to light and accommodation. No scleral icterus. Extraocular muscles intact.  HEENT: Head  Normocephalic. bruising on the face  Oropharynx and nasopharynx clear.  NECK:  Supple, no jugular venous distention. No thyroid enlargement, no tenderness.  LUNGS: Normal breath sounds bilaterally, no wheezing, rales,rhonchi or crepitation. No use of accessory muscles of respiration. Decreased basilar breath sounds CARDIOVASCULAR: S1, S2 normal. No murmurs, rubs, or gallops.  ABDOMEN: Soft, nontender, nondistended. Bowel sounds present. No organomegaly  or mass.  EXTREMITIES: No pedal edema, cyanosis, or clubbing. Achondroplasia NEUROLOGIC: Cranial nerves II through XII are intact. Muscle strength 5/5 in all extremities. Sensation intact. Gait not checked.  PSYCHIATRIC: The patient is alert and oriented x 2-3.  SKIN: No obvious rash, lesion, or ulcer.    LABORATORY PANEL:   CBC  Recent Labs Lab 04/02/16 1429  WBC 13.2*  HGB 14.6  HCT 42.0  PLT 205   ------------------------------------------------------------------------------------------------------------------  Chemistries   Recent Labs Lab 04/02/16 1429 04/02/16 1434  NA 140  --   K 4.7  --   CL 103  --   CO2 29  --   GLUCOSE 137*  --   BUN 21*  --   CREATININE 0.70  --   CALCIUM 8.5*  --   MG  --  2.1   ------------------------------------------------------------------------------------------------------------------  Cardiac Enzymes  Recent Labs Lab 04/02/16 1434  TROPONINI 0.04*   ------------------------------------------------------------------------------------------------------------------  RADIOLOGY:  Dg Chest 2 View  Result Date: 04/02/2016 CLINICAL DATA:  Larey SeatFell in kitchen, turned too fast, got dizzy and fell landing on face, struck nose, laceration across bridge of nose, hypoxia, question pneumonia or atelectasis EXAM: CHEST  2 VIEW COMPARISON:  Non FINDINGS: Marked thoracic deformity secondary to marked levoconvex thoracic scoliosis. Enlargement of cardiac silhouette. Mediastinal contours and pulmonary vascularity normal for the degree of scoliosis. RIGHT basilar atelectasis and small RIGHT pleural effusion. Remaining lungs grossly clear. No definite pneumothorax. Marked osseous demineralization without definite fractures. IMPRESSION: Marked thoracic deformity secondary to pronounced levoconvex thoracic scoliosis. Small RIGHT pleural effusion and RIGHT basilar atelectasis. Enlargement of cardiac silhouette. Note definite acute bony abnormalities.  Electronically Signed   By: Ulyses SouthwardMark  Boles M.D.   On: 04/02/2016 15:58  Ct Head Wo Contrast  Result Date: 04/02/2016 CLINICAL DATA:  Facial trauma.  Periorbital swelling.  Fall. EXAM: CT HEAD WITHOUT CONTRAST CT MAXILLOFACIAL WITHOUT CONTRAST TECHNIQUE: Multidetector CT imaging of the head and maxillofacial structures were performed using the standard protocol without intravenous contrast. Multiplanar CT image reconstructions of the maxillofacial structures were also generated. COMPARISON:  None. FINDINGS: CT HEAD FINDINGS Brain: There is atrophy and chronic small vessel disease changes. No acute intracranial abnormality. Specifically, no hemorrhage, hydrocephalus, mass lesion, acute infarction, or significant intracranial injury. Vascular: No hyperdense vessel or unexpected calcification. Skull: No acute calvarial abnormality. Sinuses/Orbits: Visualized paranasal sinuses are clear. Fluid noted in the right mastoid air cells. Left mastoid air cells clear. Orbital soft tissues unremarkable. Other: None CT MAXILLOFACIAL FINDINGS Osseous: No fracture or mandibular dislocation. No destructive process. Orbits: Negative. No traumatic or inflammatory finding. Sinuses: Clear. Soft tissues: Soft tissue swelling in the region of the nose and nasal bridge. IMPRESSION: No acute intracranial abnormality. Atrophy, chronic small vessel disease. No facial fracture. Electronically Signed   By: Charlett Nose M.D.   On: 04/02/2016 16:14   Ct Angio Chest Pe W And/or Wo Contrast  Result Date: 04/02/2016 CLINICAL DATA:  Hypoxia, concern for pulmonary embolism. Elevated D-dimer. Falling kitchen. EXAM: CT ANGIOGRAPHY CHEST WITH CONTRAST TECHNIQUE: Multidetector CT imaging of the chest was performed using the standard protocol during bolus administration of intravenous contrast. Multiplanar CT image reconstructions and MIPs were obtained to evaluate the vascular anatomy. CONTRAST:  Study 5 mL Isovue 370. COMPARISON:  Radiograph  04/02/2016 FINDINGS: Cardiovascular: No filling defects within the pulmonary arteries to suggest acute pulmonary embolism. Dilatation of the RIGHT pulmonary artery to 28 mm. No acute findings aorta great vessels. Calcification of the aortic arch. No pericardial fluid. Mediastinum/Nodes: No axillary or supraclavicular lymphadenopathy. No mediastinal hilar lymphadenopathy. No pericardial fluid esophagus normal. Lungs/Pleura: 5 mm nodule in the RIGHT upper lobe (image 40, series 6). Bilobed nodule in the RIGHT lower lobe measures 11 mm by 19 mm. This is smoothly marginated (image 50, series 6). Consolidation within the RIGHT lower lobe versus atelectasis. Small nodule in the LEFT upper lobe measures 3 mm on image 23, series 6. Upper Abdomen: Limited view of the liver, kidneys, pancreas are unremarkable. Normal adrenal glands. Musculoskeletal: There is severe rotatory scoliosis of the spine which distorts the thorax and upper abdomen. No acute or aggressive osseous lesion. Review of the MIP images confirms the above findings. IMPRESSION: 1. No evidence of acute pulmonary embolism. 2. Infiltrate versus atelectasis in the RIGHT lower lobe. 3. Dilatation of the pulmonary arteries could represent pulmonary hypertension. 4. Bilobed 2 cm nodule within the RIGHT lower lobe and smaller upper lobe nodules. These are smoothly marginated and are favored benign lung nodules. Consider follow-up CT in 3 months. More aggressive approach could be an FDG PET CT scan. 5. Severe rotatory scoliosis.  No evidence of fracture. Electronically Signed   By: Genevive Bi M.D.   On: 04/02/2016 17:09   Ct Maxillofacial Wo Contrast  Result Date: 04/02/2016 CLINICAL DATA:  Facial trauma.  Periorbital swelling.  Fall. EXAM: CT HEAD WITHOUT CONTRAST CT MAXILLOFACIAL WITHOUT CONTRAST TECHNIQUE: Multidetector CT imaging of the head and maxillofacial structures were performed using the standard protocol without intravenous contrast. Multiplanar  CT image reconstructions of the maxillofacial structures were also generated. COMPARISON:  None. FINDINGS: CT HEAD FINDINGS Brain: There is atrophy and chronic small vessel disease changes. No acute intracranial abnormality. Specifically, no hemorrhage, hydrocephalus, mass lesion, acute infarction, or  significant intracranial injury. Vascular: No hyperdense vessel or unexpected calcification. Skull: No acute calvarial abnormality. Sinuses/Orbits: Visualized paranasal sinuses are clear. Fluid noted in the right mastoid air cells. Left mastoid air cells clear. Orbital soft tissues unremarkable. Other: None CT MAXILLOFACIAL FINDINGS Osseous: No fracture or mandibular dislocation. No destructive process. Orbits: Negative. No traumatic or inflammatory finding. Sinuses: Clear. Soft tissues: Soft tissue swelling in the region of the nose and nasal bridge. IMPRESSION: No acute intracranial abnormality. Atrophy, chronic small vessel disease. No facial fracture. Electronically Signed   By: Charlett Nose M.D.   On: 04/02/2016 16:14    EKG:   Orders placed or performed during the hospital encounter of 04/02/16  . EKG 12-Lead  . EKG 12-Lead  . ED EKG  . ED EKG    ASSESSMENT AND PLAN:   82 year old female with past medical history significant for achondroplasia, presents to hospital secondary to fall and noted to be hypoxic.  #1 right lower lobe pneumonia-community-acquired pneumonia. -Blood cultures are pending. -Unable to get IV, will change antibiotics to oral Ceftin and azithromycin. -Still needing 2 L of oxygen. Encourage incentive spirometer -CT of the chest negative for pulmonary embolism  #2 elevated troponin-likely demand ischemia.  #3 leukocytosis-secondary to pneumonia.  #4 DVT prophylaxis-Lovenox  Physical therapy consult pending. Family trying to get her to rehabilitation due to multiple falls   All the records are reviewed and case discussed with Care Management/Social  Workerr. Management plans discussed with the patient, family and they are in agreement.  CODE STATUS: DNR  TOTAL TIME TAKING CARE OF THIS PATIENT: 37 minutes.   POSSIBLE D/C TOMORROW, DEPENDING ON CLINICAL CONDITION.   Enid Baas M.D on 04/03/2016 at 12:36 PM  Between 7am to 6pm - Pager - [phone]  After 6pm go to [email or web address] - Social research officer, government  [facility]  Office  [phone]  CC: Primary care physician; No PCP Per Patient

## 2016-04-03 NOTE — [place] FL2 (Signed)
  [place] MEDICAID FL2 LEVEL OF CARE SCREENING TOOL     IDENTIFICATION  Patient Name: Haley Waters Birthdate: 11/08/1916 Sex: female Admission Date (Current Location): 04/02/2016  [place]   Facility and Address:  [facility], [address], [place], [place] [address]      Provider Number: [id number]  Attending Physician Name and Address:  Enid Baasadhika Kalisetti, MD  Relative Name and Phone Number:       Current Level of Care: Hospital Recommended Level of Care: Skilled Nursing Facility Prior Approval Number:    Date Approved/Denied:   PASRR Number:   [id number] [id number]   Discharge Plan: SNF    Current Diagnoses: Patient Active Problem List   Diagnosis Date Noted  . Pressure ulcer 04/03/2016  . Pneumonia 04/02/2016    Orientation RESPIRATION BLADDER Height & Weight     Time, Situation, Self, Place  O2 (2 Liters) Continent Weight: 85 lb (38.6 kg) Height:  4\' 6"  (137.2 cm)  BEHAVIORAL SYMPTOMS/MOOD NEUROLOGICAL BOWEL NUTRITION STATUS   (None.)  (None.) Continent Diet (Soft Diet)  AMBULATORY STATUS COMMUNICATION OF NEEDS Skin   Extensive Assist Verbally PU Stage and Appropriate Care (Pressure Ulcer Stage 2 on Sacrum. Pressure Ulcer Stage 1 on Right Ankle. )                       Personal Care Assistance Level of Assistance  Bathing, Feeding, Dressing Bathing Assistance: Limited assistance Feeding assistance: Independent Dressing Assistance: Limited assistance     Functional Limitations Info  Sight, Hearing, Speech Sight Info: Adequate Hearing Info: Impaired Speech Info: Adequate    SPECIAL CARE FACTORS FREQUENCY  OT (By licensed OT), PT (By licensed PT)     PT Frequency:  (5) OT Frequency:  (5)            Contractures      Additional Factors Info  Code Status, Allergies Code Status Info:  (DNR) Allergies Info:  (No Known Allergies )           Current Medications (04/03/2016):  This is the  current hospital active medication list Current Facility-Administered Medications  Medication Dose Route Frequency Provider Last Rate Last Dose  . acetaminophen (TYLENOL) tablet 650 mg  650 mg Oral Q6H PRN Enedina FinnerSona Patel, MD       Or  . acetaminophen (TYLENOL) suppository 650 mg  650 mg Rectal Q6H PRN Enedina FinnerSona Patel, MD      . azithromycin (ZITHROMAX) 200 MG/5ML suspension 250 mg  250 mg Oral Daily Enedina FinnerSona Patel, MD      . cefUROXime (CEFTIN) tablet 500 mg  500 mg Oral Daily Enid Baasadhika Kalisetti, MD   500 mg at 04/03/16 1355  . enoxaparin (LOVENOX) injection 30 mg  30 mg Subcutaneous QHS Enedina FinnerSona Patel, MD   30 mg at 04/02/16 2340  . ondansetron (ZOFRAN) tablet 4 mg  4 mg Oral Q6H PRN Enedina FinnerSona Patel, MD       Or  . ondansetron (ZOFRAN) injection 4 mg  4 mg Intravenous Q6H PRN Enedina FinnerSona Patel, MD      . sodium chloride flush (NS) 0.9 % injection 3 mL  3 mL Intravenous Q12H Enedina FinnerSona Patel, MD         Discharge Medications: Please see discharge summary for a list of discharge medications.  Relevant Imaging Results:  Relevant Lab Results:   Additional Information  (SSN: [id number])  Ralene BatheMackenzie Rayfield, Student-Social Work [phone]

## 2016-04-03 NOTE — Clinical Social Work Placement (Signed)
   CLINICAL SOCIAL WORK PLACEMENT  NOTE  Date:  04/03/2016  Patient Details  Name: Haley Waters MRN: [id number] Date of Birth: 09/21/1916  Clinical Social Work is seeking post-discharge placement for this patient at the Skilled  Nursing Facility level of care (*CSW will initial, date and re-position this form in  chart as items are completed):  Yes   Patient/family provided with [facility] [place] Clinical Social Work Department's list of facilities offering this level of care within the geographic area requested by the patient (or if unable, by the patient's family).  Yes   Patient/family informed of their freedom to choose among providers that offer the needed level of care, that participate in Medicare, Medicaid or managed care program needed by the patient, have an available bed and are willing to accept the patient.  Yes   Patient/family informed of [facility]'s ownership interest in [facility] and [facility], as well as of the fact that they are under no obligation to receive care at these facilities.  PASRR submitted to EDS on 04/03/16     PASRR number received on 04/03/16     Existing PASRR number confirmed on       FL2 transmitted to all facilities in geographic area requested by pt/family on 04/03/16     FL2 transmitted to all facilities within larger geographic area on       Patient informed that his/her managed care company has contracts with or will negotiate with certain facilities, including the following:            Patient/family informed of bed offers received.  Patient chooses bed at       Physician recommends and patient chooses bed at      Patient to be transferred to   on  .  Patient to be transferred to facility by       Patient family notified on   of transfer.  Name of family member notified:        PHYSICIAN       Additional Comment:    _______________________________________________ Aleenah Homen, Darleen CrockerBailey M, LCSW 04/03/2016, 4:20 PM

## 2016-04-03 NOTE — Plan of Care (Signed)
Problem: SLP Dysphagia Goals Goal: Misc Dysphagia Goal Pt will safely tolerate po diet of least restrictive consistency w/ no overt s/s of aspiration noted by Staff/pt/family x3 sessions.    

## 2016-04-03 NOTE — Care Management (Signed)
RNCM met with daughter and patient at bedside. Patient lying in bed with multiple bruises and scabbing ares to body. Spoke with daughter, Haley Waters [phone]). Patient lives at home with Haley Waters and her boyfriend. Gail's boyfriend is disabled and at home with patient periodically during the day when he is not at appointments. Patient uses a cane. Daughter reports many falls in the home. She has no PCP. Haley Waters states her mother hates hospitals and doctors and refuses to go. Discussed process of PT evaluation. Haley Waters is interested in [facility]. No facility preference. She states patient makes about $1300  Per month so she cant afford to pay out of pocket. RNCM informed Haley Waters PT should see patient today and CSW or RNCM will follow up following that evaluation.

## 2016-04-03 NOTE — Evaluation (Signed)
Clinical/Bedside Swallow Evaluation Patient Details  Name: Francessca Friis MRN: [id number] Date of Birth: 1916-12-14  Today's Date: 04/03/2016 Time: SLP Start Time (ACUTE ONLY): 1335 SLP Stop Time (ACUTE ONLY): 1435 SLP Time Calculation (min) (ACUTE ONLY): 60 min  Past Medical History: History reviewed. No pertinent past medical history. Past Surgical History:  Past Surgical History:  Procedure Laterality Date  . CLUB FOOT RELEASE Bilateral   . EYE SURGERY     HPI:  Pt is a 82 y.o. female with a known history of club foot status post surgery comes to the emergency room after she had a mechanical fall in the kitchen due to dizzy spell she got after she turned around very fast. Patient face down on the floor she had a laceration over her nasal bridge which has been sutured in the emergency room no other trauma reported CT of the head otherwise is negative. Patient was found to be tachycardic and hypoxic with sats 88-89% for the workup showed she has right lower lobe pneumonia versus atelectasis. Nursing stated pt may have been drinking too fast this morning (w/ meds?) and became red/choked. She tolerated meds after when given w/ a thickened liquid. Pt denied any trouble swallowing at home prior to this admission. She currently denies any difficulty chewing or swallowing d/t the nose/upper lip bruising. She denied being hungry for anything to eat. Noted recent CXR: RIGHT basilar atelectasis and small RIGHT pleural effusion; grossly clear.   Assessment / Plan / Recommendation Clinical Impression  Pt appears to adequately tolerate trials of thin liquids VIA CUP ONLY and purees/softened solids w/ no immediate, overt coughing or gross s/s of aspiration. Pt exhibited a mild phlegminess post multiple boluses of ice cream accepted toward end but this did not increase in severity and cleared moreso alternating w/ sips of water. Oral phase appeared grossly wfl w/ the trials given - well-softened, moistened  solids in small pieces. No significant decline in respiratory status or change in vocal quality noted w/ trials given. Encouraged pt to slow down(ice cream) and take rest breaks when needed to avoid any increased WOB. Pt did require some assistance w/ tray setup d/t overall weakness. Pt appears at reduced risk for aspiration following aspiration precautions and given support during meals. She would benefit from a modified food consistency diet for easier mastication and oral manangement(dentition deficits) - suspect pt ate similar softened foods at baseline d/t advanced age. Recommend a Dysphagia 3 diet w/ ground meats/gravy, thin liquids via cup(no straws); aspiration precautions; Meds given in Puree. Will f/u w/ education and monitoring of toleration of diet w/ need to modify diet as indicated. Discussed above w/ NSG who agreed.     Aspiration Risk   (reduced following aspiration precautions and w/ support)    Diet Recommendation  Dysphagia 3 w/ thin liquids - CUP ONLY, NO STRAWS.  Meds in Puree - Whole as able.  Tray setup and assistance at all meals.  Medication Administration: Whole meds with puree    Other  Recommendations Recommended Consults:  (dietician f/u) Oral Care Recommendations: Oral care BID;Staff/trained caregiver to provide oral care   Follow up Recommendations  None (TBD)    Frequency and Duration min 2x/week  1 week       Prognosis Prognosis for Safe Diet Advancement: Fair (-Good)      Swallow Study   General Date of Onset: 04/02/16 HPI: Pt is a 83 y.o. female with a known history of club foot status post surgery comes to the  emergency room after she had a mechanical fall in the kitchen due to dizzy spell she got after she turned around very fast. Patient face down on the floor she had a laceration over her nasal bridge which has been sutured in the emergency room no other trauma reported CT of the head otherwise is negative. Patient was found to be tachycardic and  hypoxic with sats 88-89% for the workup showed she has right lower lobe pneumonia versus atelectasis. Nursing stated pt may have been drinking too fast this morning (w/ meds?) and became red/choked. She tolerated meds after when given w/ a thickened liquid. Pt denied any trouble swallowing at home prior to this admission. She currently denies any difficulty chewing or swallowing d/t the nose/upper lip bruising. She denied being hungry for anything to eat. Noted recent CXR: RIGHT basilar atelectasis and small RIGHT pleural effusion; grossly clear. Type of Study: Bedside Swallow Evaluation Previous Swallow Assessment: none indicated Diet Prior to this Study: Thin liquids (soft foods at home per pt) Temperature Spikes Noted: No (wbc elevated) Respiratory Status: Nasal cannula (2 liters) History of Recent Intubation: No Behavior/Cognition: Alert;Cooperative;Pleasant mood;Requires cueing;Distractible (min) Oral Cavity Assessment: Dry Oral Care Completed by SLP: Recent completion by staff Oral Cavity - Dentition: Poor condition;Missing dentition Vision: Functional for self-feeding Self-Feeding Abilities: Able to feed self;Needs assist;Needs set up Patient Positioning: Upright in bed Baseline Vocal Quality: Low vocal intensity Volitional Cough: Weak Volitional Swallow: Able to elicit    Oral/Motor/Sensory Function Overall Oral Motor/Sensory Function: Within functional limits   Ice Chips Ice chips: Within functional limits Presentation: Spoon (fed; 2 trials)   Thin Liquid Thin Liquid: Within functional limits Presentation: Cup;Self Fed (8 trials)    Nectar Thick Nectar Thick Liquid: Not tested   Honey Thick Honey Thick Liquid: Not tested   Puree Puree: Within functional limits Presentation: Self Fed;Spoon (8 trials)   Solid   GO   Solid: Within functional limits (moistened, softened well) Presentation: Spoon;Self Fed (4 trials) Other Comments: stated they "went down"        Jerilynn SomKatherine  Watson, MS, CCC-SLP  Watson,Katherine 04/03/2016,3:04 PM
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
## 2016-04-03 NOTE — Progress Notes (Signed)
Patient is A&O x3, but forgetful. HOH. On 2L O2, Sats in the middle 90's, desats with activity. No IV site, MD aware. Tolerated oral ABX in applesause. Does well with eating with assistance. Up with walker and assist x1. See skin issues in assessment. BM this shift.

## 2016-04-04 LAB — CBC
HCT: 35.5 % (ref 35.0–47.0)
HEMOGLOBIN: 12.6 g/dL (ref 12.0–16.0)
MCH: 33.1 pg (ref 26.0–34.0)
MCHC: 35.5 g/dL (ref 32.0–36.0)
MCV: 93.3 fL (ref 80.0–100.0)
Platelets: 141 10*3/uL — ABNORMAL LOW (ref 150–440)
RBC: 3.81 MIL/uL (ref 3.80–5.20)
RDW: 14.1 % (ref 11.5–14.5)
WBC: 9.8 10*3/uL (ref 3.6–11.0)

## 2016-04-04 MED ORDER — AZITHROMYCIN 200 MG/5ML PO SUSR: 250.0000 mg | Freq: Every day | ORAL | 0 refills | Status: AC

## 2016-04-04 MED ORDER — CEFUROXIME AXETIL 500 MG PO TABS: 500.0000 mg | ORAL_TABLET | Freq: Every day | ORAL | 0 refills | Status: AC

## 2016-04-04 MED ORDER — ACETAMINOPHEN 325 MG PO TABS: 650.0000 mg | ORAL_TABLET | Freq: Four times a day (QID) | ORAL | Status: AC | PRN
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
## 2016-04-04 NOTE — Discharge Instructions (Signed)

## 2016-04-04 NOTE — Care Management Important Message (Signed)
Important Message  Patient Details  Name: Haley ChiquitoMarion Waters MRN: [id number] Date of Birth: 10/01/1916   Medicare Important Message Given:  Yes    Marily MemosLisa M Genesi Stefanko, RN 04/04/2016, 11:17 AM

## 2016-04-04 NOTE — Discharge Summary (Addendum)
[facility] [place] [facility]   PATIENT NAME: Haley Waters    MR#:  [id number]  DATE OF BIRTH:  1917-07-09  DATE OF ADMISSION:  04/02/2016 ADMITTING PHYSICIAN: Enedina Finner, MD  DATE OF DISCHARGE: 04/05/2016  PRIMARY CARE PHYSICIAN: No PCP Per Patient    ADMISSION DIAGNOSIS:  Tachycardia [R00.0] Laceration [T14.8] Troponin level elevated [R79.89] Acute respiratory failure with hypoxia (HCC) [J96.01] Fall from standing, initial encounter [W19.XXXA] Aspiration pneumonia of right lower lobe, unspecified aspiration pneumonia type (HCC) [J69.0]  DISCHARGE DIAGNOSIS:  Active Problems:   Pneumonia   Pressure ulcer   SECONDARY DIAGNOSIS:  History reviewed. No pertinent past medical history.  HOSPITAL COURSE:   1. Community-acquired pneumonia in the right lower lobe with leukocytosis. Ceftin and Zithromax prescribed. Stop Zithromax after 2 days. Stop Ceftin after 5 days. 2. Acute hypoxic respiratory failure. Pulse ox at rest ranges between 89% and 96%. At the facility I would check her with ambulation to make sure that she doesn't desaturate. If she desaturates less than 88% with ambulation would be a candidate for oxygen at the facility. Continue incentive spirometry every hour while awake. 3. Frequent falls. Clubfeet. Sutures need to be taken out of the nose in 7 days. 4. Weakness needs physical therapy evaluation at rehabilitation. 5. Elevated troponin. This is demand ischemia. No chest pain or shortness of breath. 6. Positive blood culture is a skin contaminant. This was confirmed 04/05/2016 in the a.m. with the microbiology lab on the phone. 7. Tachycardia. Likely secondary to pneumonia. Need to monitor this with ambulation. If heart rate goes too fast with ambulation may end up needing a low dose Toprol XL. I will hold off on this at this time secondary to patient being 73 and we don't want to make her dizzy with her ambulation.  DISCHARGE CONDITIONS:    Fair  CONSULTS OBTAINED:   none  DRUG ALLERGIES:  No Known Allergies  DISCHARGE MEDICATIONS:   Current Discharge Medication List    START taking these medications   Details  acetaminophen (TYLENOL) 325 MG tablet Take 2 tablets (650 mg total) by mouth every 6 (six) hours as needed for mild pain (or Fever >/= 101).    azithromycin (ZITHROMAX) 200 MG/5ML suspension Take 6.3 mLs (250 mg total) by mouth daily. Qty: 12.6 mL, Refills: 0    cefUROXime (CEFTIN) 500 MG tablet Take 1 tablet (500 mg total) by mouth daily. Qty: 10 tablet, Refills: 0         DISCHARGE INSTRUCTIONS:    Patient is a DO NOT RESUSCITATE  If you experience worsening of your admission symptoms, develop shortness of breath, life threatening emergency, suicidal or homicidal thoughts you must seek medical attention immediately by calling 911 or calling your MD immediately  if symptoms less severe.  You Must read complete instructions/literature along with all the possible adverse reactions/side effects for all the Medicines you take and that have been prescribed to you. Take any new Medicines after you have completely understood and accept all the possible adverse reactions/side effects.   Please note  You were cared for by a hospitalist during your hospital stay. If you have any questions about your discharge medications or the care you received while you were in the hospital after you are discharged, you can call the unit and asked to speak with the hospitalist on call if the hospitalist that took care of you is not available. Once you are discharged, your primary care physician will handle any further  medical issues. Please note that NO REFILLS for any discharge medications will be authorized once you are discharged, as it is imperative that you return to your primary care physician (or establish a relationship with a primary care physician if you do not have one) for your aftercare needs so that they can  reassess your need for medications and monitor your lab values.    Today   CHIEF COMPLAINT:  No chief complaint on file.   HISTORY OF PRESENT ILLNESS:  Haley Waters  is a 82 y.o. female presented after a fall.   VITAL SIGNS:  Blood pressure (!) 145/65, pulse 99, temperature 97.7 F (36.5 C), temperature source Oral, resp. rate (!) 22, height 4\' 6"  (1.372 m), weight 38.6 kg (85 lb), SpO2 93 %.    PHYSICAL EXAMINATION:  GENERAL:  82 y.o.-year-old patient lying in the bed with no acute distress.  EYES: Pupils equal, round, reactive to light and accommodation. No scleral icterus. Extraocular muscles intact.  HEENT: Head atraumatic, normocephalic. Oropharynx and nasopharynx clear.  NECK:  Supple, no jugular venous distention. No thyroid enlargement, no tenderness.  LUNGS: Decreased breath sounds bilaterally, no wheezing, rales,rhonchi or crepitation. No use of accessory muscles of respiration.  CARDIOVASCULAR: S1, S2 normal. 2/6 systolic murmurs, no rubs, or gallops.  ABDOMEN: Soft, non-tender, non-distended. Bowel sounds present. No organomegaly or mass.  EXTREMITIES: No pedal edema, cyanosis, or clubbing.  NEUROLOGIC: Cranial nerves II through XII are intact. Muscle strength 5/5 in all extremities. Sensation intact. Gait not checked.  PSYCHIATRIC: The patient is alert and oriented x 3.  SKIN: Sutures on the nose. Bruising bilateral eyelids and cheek and over the top lip.  DATA REVIEW:   CBC  Recent Labs Lab 04/04/16 0347  WBC 9.8  HGB 12.6  HCT 35.5  PLT 141*    Chemistries   Recent Labs Lab 04/02/16 1429 04/02/16 1434  NA 140  --   K 4.7  --   CL 103  --   CO2 29  --   GLUCOSE 137*  --   BUN 21*  --   CREATININE 0.70  --   CALCIUM 8.5*  --   MG  --  2.1    Cardiac Enzymes  Recent Labs Lab 04/02/16 1434  TROPONINI 0.04*    Microbiology Results  Results for orders placed or performed during the hospital encounter of 04/02/16  Blood culture  (routine x 2)     Status: None (Preliminary result)   Collection Time: 04/02/16  6:25 PM  Result Value Ref Range Status   Specimen Description BLOOD LEFT FA  Final   Special Requests   Final    BOTTLES DRAWN AEROBIC AND ANAEROBIC AER 5CC ANA 5CC   Culture NO GROWTH 2 DAYS  Final   Report Status PENDING  Incomplete  Blood culture (routine x 2)     Status: None (Preliminary result)   Collection Time: 04/02/16  6:25 PM  Result Value Ref Range Status   Specimen Description BLOOD RIGHT AC  Final   Special Requests   Final    BOTTLES DRAWN AEROBIC AND ANAEROBIC AER 7CC ANA10CC   Culture  Setup Time   Final    Organism ID to follow GRAM POSITIVE COCCI AEROBIC BOTTLE ONLY CRITICAL RESULT CALLED TO, READ BACK BY AND VERIFIED WITH: JASON ROBBINS ON 04/03/16 AT 2202 BY TLB CONFIRMED BY MSS/TLB    Culture   Final    GRAM POSITIVE COCCI AEROBIC BOTTLE ONLY IDENTIFICATION TO FOLLOW  Report Status PENDING  Incomplete  Blood Culture ID Panel (Reflexed)     Status: Abnormal   Collection Time: 04/02/16  6:25 PM  Result Value Ref Range Status   Enterococcus species NOT DETECTED NOT DETECTED Final   Vancomycin resistance NOT DETECTED NOT DETECTED Final   Listeria monocytogenes NOT DETECTED NOT DETECTED Final   Staphylococcus species DETECTED (A) NOT DETECTED Final    Comment: CRITICAL RESULT CALLED TO, READ BACK BY AND VERIFIED WITH: JASON ROBBINS ON 04/03/16 AT 2202 BY TLB    Staphylococcus aureus NOT DETECTED NOT DETECTED Final   Methicillin resistance NOT DETECTED NOT DETECTED Final   Streptococcus species NOT DETECTED NOT DETECTED Final   Streptococcus agalactiae NOT DETECTED NOT DETECTED Final   Streptococcus pneumoniae NOT DETECTED NOT DETECTED Final   Streptococcus pyogenes NOT DETECTED NOT DETECTED Final   Acinetobacter baumannii NOT DETECTED NOT DETECTED Final   Enterobacteriaceae species NOT DETECTED NOT DETECTED Final   Enterobacter cloacae complex NOT DETECTED NOT DETECTED Final    Escherichia coli NOT DETECTED NOT DETECTED Final   Klebsiella oxytoca NOT DETECTED NOT DETECTED Final   Klebsiella pneumoniae NOT DETECTED NOT DETECTED Final   Proteus species NOT DETECTED NOT DETECTED Final   Serratia marcescens NOT DETECTED NOT DETECTED Final   Carbapenem resistance NOT DETECTED NOT DETECTED Final   Haemophilus influenzae NOT DETECTED NOT DETECTED Final   Neisseria meningitidis NOT DETECTED NOT DETECTED Final   Pseudomonas aeruginosa NOT DETECTED NOT DETECTED Final   Candida albicans NOT DETECTED NOT DETECTED Final   Candida glabrata NOT DETECTED NOT DETECTED Final   Candida krusei NOT DETECTED NOT DETECTED Final   Candida parapsilosis NOT DETECTED NOT DETECTED Final   Candida tropicalis NOT DETECTED NOT DETECTED Final    RADIOLOGY:  Dg Chest 2 View  Result Date: 04/02/2016 CLINICAL DATA:  Larey Seat in kitchen, turned too fast, got dizzy and fell landing on face, struck nose, laceration across bridge of nose, hypoxia, question pneumonia or atelectasis EXAM: CHEST  2 VIEW COMPARISON:  Non FINDINGS: Marked thoracic deformity secondary to marked levoconvex thoracic scoliosis. Enlargement of cardiac silhouette. Mediastinal contours and pulmonary vascularity normal for the degree of scoliosis. RIGHT basilar atelectasis and small RIGHT pleural effusion. Remaining lungs grossly clear. No definite pneumothorax. Marked osseous demineralization without definite fractures. IMPRESSION: Marked thoracic deformity secondary to pronounced levoconvex thoracic scoliosis. Small RIGHT pleural effusion and RIGHT basilar atelectasis. Enlargement of cardiac silhouette. Note definite acute bony abnormalities. Electronically Signed   By: Ulyses Southward M.D.   On: 04/02/2016 15:58   Ct Head Wo Contrast  Result Date: 04/02/2016 CLINICAL DATA:  Facial trauma.  Periorbital swelling.  Fall. EXAM: CT HEAD WITHOUT CONTRAST CT MAXILLOFACIAL WITHOUT CONTRAST TECHNIQUE: Multidetector CT imaging of the head and  maxillofacial structures were performed using the standard protocol without intravenous contrast. Multiplanar CT image reconstructions of the maxillofacial structures were also generated. COMPARISON:  None. FINDINGS: CT HEAD FINDINGS Brain: There is atrophy and chronic small vessel disease changes. No acute intracranial abnormality. Specifically, no hemorrhage, hydrocephalus, mass lesion, acute infarction, or significant intracranial injury. Vascular: No hyperdense vessel or unexpected calcification. Skull: No acute calvarial abnormality. Sinuses/Orbits: Visualized paranasal sinuses are clear. Fluid noted in the right mastoid air cells. Left mastoid air cells clear. Orbital soft tissues unremarkable. Other: None CT MAXILLOFACIAL FINDINGS Osseous: No fracture or mandibular dislocation. No destructive process. Orbits: Negative. No traumatic or inflammatory finding. Sinuses: Clear. Soft tissues: Soft tissue swelling in the region of the nose and nasal bridge.  IMPRESSION: No acute intracranial abnormality. Atrophy, chronic small vessel disease. No facial fracture. Electronically Signed   By: Charlett Nose M.D.   On: 04/02/2016 16:14   Ct Angio Chest Pe W And/or Wo Contrast  Result Date: 04/02/2016 CLINICAL DATA:  Hypoxia, concern for pulmonary embolism. Elevated D-dimer. Falling kitchen. EXAM: CT ANGIOGRAPHY CHEST WITH CONTRAST TECHNIQUE: Multidetector CT imaging of the chest was performed using the standard protocol during bolus administration of intravenous contrast. Multiplanar CT image reconstructions and MIPs were obtained to evaluate the vascular anatomy. CONTRAST:  Study 5 mL Isovue 370. COMPARISON:  Radiograph 04/02/2016 FINDINGS: Cardiovascular: No filling defects within the pulmonary arteries to suggest acute pulmonary embolism. Dilatation of the RIGHT pulmonary artery to 28 mm. No acute findings aorta great vessels. Calcification of the aortic arch. No pericardial fluid. Mediastinum/Nodes: No axillary or  supraclavicular lymphadenopathy. No mediastinal hilar lymphadenopathy. No pericardial fluid esophagus normal. Lungs/Pleura: 5 mm nodule in the RIGHT upper lobe (image 40, series 6). Bilobed nodule in the RIGHT lower lobe measures 11 mm by 19 mm. This is smoothly marginated (image 50, series 6). Consolidation within the RIGHT lower lobe versus atelectasis. Small nodule in the LEFT upper lobe measures 3 mm on image 23, series 6. Upper Abdomen: Limited view of the liver, kidneys, pancreas are unremarkable. Normal adrenal glands. Musculoskeletal: There is severe rotatory scoliosis of the spine which distorts the thorax and upper abdomen. No acute or aggressive osseous lesion. Review of the MIP images confirms the above findings. IMPRESSION: 1. No evidence of acute pulmonary embolism. 2. Infiltrate versus atelectasis in the RIGHT lower lobe. 3. Dilatation of the pulmonary arteries could represent pulmonary hypertension. 4. Bilobed 2 cm nodule within the RIGHT lower lobe and smaller upper lobe nodules. These are smoothly marginated and are favored benign lung nodules. Consider follow-up CT in 3 months. More aggressive approach could be an FDG PET CT scan. 5. Severe rotatory scoliosis.  No evidence of fracture. Electronically Signed   By: Genevive Bi M.D.   On: 04/02/2016 17:09   Ct Maxillofacial Wo Contrast  Result Date: 04/02/2016 CLINICAL DATA:  Facial trauma.  Periorbital swelling.  Fall. EXAM: CT HEAD WITHOUT CONTRAST CT MAXILLOFACIAL WITHOUT CONTRAST TECHNIQUE: Multidetector CT imaging of the head and maxillofacial structures were performed using the standard protocol without intravenous contrast. Multiplanar CT image reconstructions of the maxillofacial structures were also generated. COMPARISON:  None. FINDINGS: CT HEAD FINDINGS Brain: There is atrophy and chronic small vessel disease changes. No acute intracranial abnormality. Specifically, no hemorrhage, hydrocephalus, mass lesion, acute infarction, or  significant intracranial injury. Vascular: No hyperdense vessel or unexpected calcification. Skull: No acute calvarial abnormality. Sinuses/Orbits: Visualized paranasal sinuses are clear. Fluid noted in the right mastoid air cells. Left mastoid air cells clear. Orbital soft tissues unremarkable. Other: None CT MAXILLOFACIAL FINDINGS Osseous: No fracture or mandibular dislocation. No destructive process. Orbits: Negative. No traumatic or inflammatory finding. Sinuses: Clear. Soft tissues: Soft tissue swelling in the region of the nose and nasal bridge. IMPRESSION: No acute intracranial abnormality. Atrophy, chronic small vessel disease. No facial fracture. Electronically Signed   By: Charlett Nose M.D.   On: 04/02/2016 16:14   Management plans discussed with the patient, family and they are in agreement.  CODE STATUS:     Code Status Orders        Start     Ordered   04/02/16 2137  Do not attempt resuscitation (DNR)  Continuous    Question Answer Comment  In the event of  cardiac or respiratory ARREST Do not call a "code blue"   In the event of cardiac or respiratory ARREST Do not perform Intubation, CPR, defibrillation or ACLS   In the event of cardiac or respiratory ARREST Use medication by any route, position, wound care, and other measures to relive pain and suffering. May use oxygen, suction and manual treatment of airway obstruction as needed for comfort.      04/02/16 2136    Code Status History    Date Active Date Inactive Code Status Order ID Comments User Context   This patient has a current code status but no historical code status.    Advance Directive Documentation   Flowsheet Row Most Recent Value  Type of Advance Directive  Healthcare Power of Attorney, Living will  Pre-existing out of facility DNR order (yellow form or pink MOST form)  No data  "MOST" Form in Place?  No data      TOTAL TIME TAKING CARE OF THIS PATIENT: 35 minutes.    Alford HighlandWIETING, Lequisha Cammack M.D on 04/04/2016  at 1:57 PM  Between 7am to 6pm - Pager - [phone]  After 6pm go to [email or web address] - password [email or web address]  [facility] Office  [phone]  CC: Primary care physician; No PCP Per Patient

## 2016-04-04 NOTE — Progress Notes (Signed)
Patient ID: Haley Waters, female   DOB: Nov 22, 1916, 82 y.o.   MRN: [id number]  [facility] PROGRESS NOTE  Patrisia Faeth WUJ:[id number] DOB: 27-Dec-1916 DOA: 04/02/2016 PCP: No PCP Per Patient  HPI/Subjective: Patient feels okay. Offers no complaints. States her breathing is okay.  Objective: Vitals:   04/04/16 0732 04/04/16 0800  BP: (!) 145/65   Pulse: 99   Resp: (!) 22   Temp:  97.7 F (36.5 C)    Filed Weights   04/02/16 1419  Weight: 38.6 kg (85 lb)    ROS: Review of Systems  Constitutional: Negative for chills and fever.  Eyes: Negative for blurred vision.  Respiratory: Negative for cough and shortness of breath.   Cardiovascular: Negative for chest pain.  Gastrointestinal: Negative for abdominal pain, constipation, diarrhea, nausea and vomiting.  Genitourinary: Negative for dysuria.  Musculoskeletal: Negative for joint pain.  Neurological: Negative for dizziness and headaches.   Exam: Physical Exam  HENT:  Nose: No mucosal edema.  Mouth/Throat: No oropharyngeal exudate or posterior oropharyngeal edema.  Eyes: Conjunctivae, EOM and lids are normal. Pupils are equal, round, and reactive to light.  Neck: No JVD present. Carotid bruit is not present. No edema present. No thyroid mass and no thyromegaly present.  Cardiovascular: S1 normal and S2 normal.  Exam reveals no gallop.   Murmur heard.  Systolic murmur is present with a grade of 2/6  Pulses:      Dorsalis pedis pulses are 2+ on the right side, and 2+ on the left side.  Respiratory: No respiratory distress. She has no wheezes. She has no rhonchi. She has no rales.  GI: Soft. Bowel sounds are normal. There is no tenderness.  Musculoskeletal:       Right ankle: She exhibits no swelling.       Left ankle: She exhibits swelling.  Lymphadenopathy:    She has no cervical adenopathy.  Neurological: She is alert. No cranial nerve deficit.  Skin: Skin is warm. No rash noted. Nails show no clubbing.  Bruising  around bilateral eyes. Sutures seen on the nose with bruising. Bruising upper lip. Bruising bilateral arms  Psychiatric: She has a normal mood and affect.      Data Reviewed: Basic Metabolic Panel:  Recent Labs Lab 04/02/16 1429 04/02/16 1434  NA 140  --   K 4.7  --   CL 103  --   CO2 29  --   GLUCOSE 137*  --   BUN 21*  --   CREATININE 0.70  --   CALCIUM 8.5*  --   MG  --  2.1   CBC:  Recent Labs Lab 04/02/16 1429 04/04/16 0347  WBC 13.2* 9.8  HGB 14.6 12.6  HCT 42.0 35.5  MCV 92.7 93.3  PLT 205 141*   Cardiac Enzymes:  Recent Labs Lab 04/02/16 1434  TROPONINI 0.04*     Recent Results (from the past 240 hour(s))  Blood culture (routine x 2)     Status: None (Preliminary result)   Collection Time: 04/02/16  6:25 PM  Result Value Ref Range Status   Specimen Description BLOOD LEFT FA  Final   Special Requests   Final    BOTTLES DRAWN AEROBIC AND ANAEROBIC AER 5CC ANA 5CC   Culture NO GROWTH 2 DAYS  Final   Report Status PENDING  Incomplete  Blood culture (routine x 2)     Status: None (Preliminary result)   Collection Time: 04/02/16  6:25 PM  Result Value Ref Range Status  Specimen Description BLOOD RIGHT [facility]  Final   Special Requests   Final    BOTTLES DRAWN AEROBIC AND ANAEROBIC AER 7CC ANA10CC   Culture  Setup Time   Final    Organism ID to follow GRAM POSITIVE COCCI AEROBIC BOTTLE ONLY CRITICAL RESULT CALLED TO, READ BACK BY AND VERIFIED WITH: JASON ROBBINS ON 04/03/16 AT 2202 BY TLB CONFIRMED BY MSS/TLB    Culture   Final    GRAM POSITIVE COCCI AEROBIC BOTTLE ONLY IDENTIFICATION TO FOLLOW    Report Status PENDING  Incomplete  Blood Culture ID Panel (Reflexed)     Status: Abnormal   Collection Time: 04/02/16  6:25 PM  Result Value Ref Range Status   Enterococcus species NOT DETECTED NOT DETECTED Final   Vancomycin resistance NOT DETECTED NOT DETECTED Final   Listeria monocytogenes NOT DETECTED NOT DETECTED Final   Staphylococcus species  DETECTED (A) NOT DETECTED Final    Comment: CRITICAL RESULT CALLED TO, READ BACK BY AND VERIFIED WITH: JASON ROBBINS ON 04/03/16 AT 2202 BY TLB    Staphylococcus aureus NOT DETECTED NOT DETECTED Final   Methicillin resistance NOT DETECTED NOT DETECTED Final   Streptococcus species NOT DETECTED NOT DETECTED Final   Streptococcus agalactiae NOT DETECTED NOT DETECTED Final   Streptococcus pneumoniae NOT DETECTED NOT DETECTED Final   Streptococcus pyogenes NOT DETECTED NOT DETECTED Final   Acinetobacter baumannii NOT DETECTED NOT DETECTED Final   Enterobacteriaceae species NOT DETECTED NOT DETECTED Final   Enterobacter cloacae complex NOT DETECTED NOT DETECTED Final   Escherichia coli NOT DETECTED NOT DETECTED Final   Klebsiella oxytoca NOT DETECTED NOT DETECTED Final   Klebsiella pneumoniae NOT DETECTED NOT DETECTED Final   Proteus species NOT DETECTED NOT DETECTED Final   Serratia marcescens NOT DETECTED NOT DETECTED Final   Carbapenem resistance NOT DETECTED NOT DETECTED Final   Haemophilus influenzae NOT DETECTED NOT DETECTED Final   Neisseria meningitidis NOT DETECTED NOT DETECTED Final   Pseudomonas aeruginosa NOT DETECTED NOT DETECTED Final   Candida albicans NOT DETECTED NOT DETECTED Final   Candida glabrata NOT DETECTED NOT DETECTED Final   Candida krusei NOT DETECTED NOT DETECTED Final   Candida parapsilosis NOT DETECTED NOT DETECTED Final   Candida tropicalis NOT DETECTED NOT DETECTED Final     Studies: Dg Chest 2 View  Result Date: 04/02/2016 CLINICAL DATA:  Larey Seat in kitchen, turned too fast, got dizzy and fell landing on face, struck nose, laceration across bridge of nose, hypoxia, question pneumonia or atelectasis EXAM: CHEST  2 VIEW COMPARISON:  Non FINDINGS: Marked thoracic deformity secondary to marked levoconvex thoracic scoliosis. Enlargement of cardiac silhouette. Mediastinal contours and pulmonary vascularity normal for the degree of scoliosis. RIGHT basilar atelectasis  and small RIGHT pleural effusion. Remaining lungs grossly clear. No definite pneumothorax. Marked osseous demineralization without definite fractures. IMPRESSION: Marked thoracic deformity secondary to pronounced levoconvex thoracic scoliosis. Small RIGHT pleural effusion and RIGHT basilar atelectasis. Enlargement of cardiac silhouette. Note definite acute bony abnormalities. Electronically Signed   By: Ulyses Southward M.D.   On: 04/02/2016 15:58   Ct Head Wo Contrast  Result Date: 04/02/2016 CLINICAL DATA:  Facial trauma.  Periorbital swelling.  Fall. EXAM: CT HEAD WITHOUT CONTRAST CT MAXILLOFACIAL WITHOUT CONTRAST TECHNIQUE: Multidetector CT imaging of the head and maxillofacial structures were performed using the standard protocol without intravenous contrast. Multiplanar CT image reconstructions of the maxillofacial structures were also generated. COMPARISON:  None. FINDINGS: CT HEAD FINDINGS Brain: There is atrophy and chronic small vessel  disease changes. No acute intracranial abnormality. Specifically, no hemorrhage, hydrocephalus, mass lesion, acute infarction, or significant intracranial injury. Vascular: No hyperdense vessel or unexpected calcification. Skull: No acute calvarial abnormality. Sinuses/Orbits: Visualized paranasal sinuses are clear. Fluid noted in the right mastoid air cells. Left mastoid air cells clear. Orbital soft tissues unremarkable. Other: None CT MAXILLOFACIAL FINDINGS Osseous: No fracture or mandibular dislocation. No destructive process. Orbits: Negative. No traumatic or inflammatory finding. Sinuses: Clear. Soft tissues: Soft tissue swelling in the region of the nose and nasal bridge. IMPRESSION: No acute intracranial abnormality. Atrophy, chronic small vessel disease. No facial fracture. Electronically Signed   By: Charlett NoseKevin  Dover M.D.   On: 04/02/2016 16:14   Ct Angio Chest Pe W And/or Wo Contrast  Result Date: 04/02/2016 CLINICAL DATA:  Hypoxia, concern for pulmonary embolism.  Elevated D-dimer. Falling kitchen. EXAM: CT ANGIOGRAPHY CHEST WITH CONTRAST TECHNIQUE: Multidetector CT imaging of the chest was performed using the standard protocol during bolus administration of intravenous contrast. Multiplanar CT image reconstructions and MIPs were obtained to evaluate the vascular anatomy. CONTRAST:  Study 5 mL Isovue 370. COMPARISON:  Radiograph 04/02/2016 FINDINGS: Cardiovascular: No filling defects within the pulmonary arteries to suggest acute pulmonary embolism. Dilatation of the RIGHT pulmonary artery to 28 mm. No acute findings aorta great vessels. Calcification of the aortic arch. No pericardial fluid. Mediastinum/Nodes: No axillary or supraclavicular lymphadenopathy. No mediastinal hilar lymphadenopathy. No pericardial fluid esophagus normal. Lungs/Pleura: 5 mm nodule in the RIGHT upper lobe (image 40, series 6). Bilobed nodule in the RIGHT lower lobe measures 11 mm by 19 mm. This is smoothly marginated (image 50, series 6). Consolidation within the RIGHT lower lobe versus atelectasis. Small nodule in the LEFT upper lobe measures 3 mm on image 23, series 6. Upper Abdomen: Limited view of the liver, kidneys, pancreas are unremarkable. Normal adrenal glands. Musculoskeletal: There is severe rotatory scoliosis of the spine which distorts the thorax and upper abdomen. No acute or aggressive osseous lesion. Review of the MIP images confirms the above findings. IMPRESSION: 1. No evidence of acute pulmonary embolism. 2. Infiltrate versus atelectasis in the RIGHT lower lobe. 3. Dilatation of the pulmonary arteries could represent pulmonary hypertension. 4. Bilobed 2 cm nodule within the RIGHT lower lobe and smaller upper lobe nodules. These are smoothly marginated and are favored benign lung nodules. Consider follow-up CT in 3 months. More aggressive approach could be an FDG PET CT scan. 5. Severe rotatory scoliosis.  No evidence of fracture. Electronically Signed   By: Genevive BiStewart  Edmunds M.D.    On: 04/02/2016 17:09   Ct Maxillofacial Wo Contrast  Result Date: 04/02/2016 CLINICAL DATA:  Facial trauma.  Periorbital swelling.  Fall. EXAM: CT HEAD WITHOUT CONTRAST CT MAXILLOFACIAL WITHOUT CONTRAST TECHNIQUE: Multidetector CT imaging of the head and maxillofacial structures were performed using the standard protocol without intravenous contrast. Multiplanar CT image reconstructions of the maxillofacial structures were also generated. COMPARISON:  None. FINDINGS: CT HEAD FINDINGS Brain: There is atrophy and chronic small vessel disease changes. No acute intracranial abnormality. Specifically, no hemorrhage, hydrocephalus, mass lesion, acute infarction, or significant intracranial injury. Vascular: No hyperdense vessel or unexpected calcification. Skull: No acute calvarial abnormality. Sinuses/Orbits: Visualized paranasal sinuses are clear. Fluid noted in the right mastoid air cells. Left mastoid air cells clear. Orbital soft tissues unremarkable. Other: None CT MAXILLOFACIAL FINDINGS Osseous: No fracture or mandibular dislocation. No destructive process. Orbits: Negative. No traumatic or inflammatory finding. Sinuses: Clear. Soft tissues: Soft tissue swelling in the region of the nose  and nasal bridge. IMPRESSION: No acute intracranial abnormality. Atrophy, chronic small vessel disease. No facial fracture. Electronically Signed   By: Charlett Nose M.D.   On: 04/02/2016 16:14    Scheduled Meds: . azithromycin  250 mg Oral Daily  . cefUROXime  500 mg Oral Daily  . enoxaparin (LOVENOX) injection  30 mg Subcutaneous QHS  . sodium chloride flush  3 mL Intravenous Q12H    Assessment/Plan:  1. Community-acquired pneumonia in the right lower lobe with leukocytosis. Patient on Ceftin and Zithromax. 2. Acute hypoxic respiratory failure. Check pulse ox in the morning. Patient may need to liters nasal cannula 3. Frequent falls. Clubfeet. Sutures on the nose need to be taken out in 7-10 days 4. Weakness.  Physical therapy at rehabilitation. Patient needs a 3 night stay. 5. Elevated troponin. Demand ischemia. No further workup 6. Positive blood culture likely skin contaminant. Still awaiting further results.  Code Status:     Code Status Orders        Start     Ordered   04/02/16 2137  Do not attempt resuscitation (DNR)  Continuous    Question Answer Comment  In the event of cardiac or respiratory ARREST Do not call a "code blue"   In the event of cardiac or respiratory ARREST Do not perform Intubation, CPR, defibrillation or ACLS   In the event of cardiac or respiratory ARREST Use medication by any route, position, wound care, and other measures to relive pain and suffering. May use oxygen, suction and manual treatment of airway obstruction as needed for comfort.      04/02/16 2136    Code Status History    Date Active Date Inactive Code Status Order ID Comments User Context   This patient has a current code status but no historical code status.    Advance Directive Documentation   Flowsheet Row Most Recent Value  Type of Advance Directive  Healthcare Power of Attorney, Living will  Pre-existing out of facility DNR order (yellow form or pink MOST form)  No data  "MOST" Form in Place?  No data     Family Communication: Spoke with daughter on the phone Disposition Plan: Hopefully out to rehabilitation tomorrow  Antibiotics:  Ceftin  Zithromax  Time spent: 35 minutes  Alford Highland  [employer]

## 2016-04-04 NOTE — Progress Notes (Signed)
Assessments remain unchanged since this a.m, Dr Renae GlossWieting has made rounds with nurse and updated. Daughter at bedside updated as well. VS wnl, MD aware of decreasing BP. No new orders obtained. No ss of distress noted. Pt has been able to get up out of bed and use BSC with one assist.

## 2016-04-04 NOTE — Progress Notes (Signed)
Speech Language Pathology Treatment: Dysphagia  Patient Details Name: Haley Waters MRN: [id number] DOB: [birth year] Today's Date: 04/04/2016 Time: 1400-1440 SLP Time Calculation (min) (ACUTE ONLY): 40 min  Assessment / Plan / Recommendation Clinical Impression  Pt appears to be tolerating her current dysphagia 3 diet w/ thin liquids via cup primarily for safer drinking and oral intake. Pt is taking meds in Puree more often crushed for easier swallowing per NSG. Pt consumed few sips of thin liquids w/ no overt s/s of aspiration noted. Recommend continue w/ a mech soft diet consistency w/ added puree foods (hot cereals, puddings, cream soups, applesauce) for easier overall intake. Pt does need assistance w/ tray setup and w/ feeding d/t overall weakness at this time. She best manages a cup/mug w/ a handle when drinking.  Updated [facility] staff.     HPI HPI: Pt is a 82 y.o. female with a known history of club foot status post surgery comes to the emergency room after she had a mechanical fall in the kitchen due to dizzy spell she got after she turned around very fast. Patient face down on the floor she had a laceration over her nasal bridge which has been sutured in the emergency room no other trauma reported CT of the head otherwise is negative. Patient was found to be tachycardic and hypoxic with sats 88-89% for the workup showed she has right lower lobe pneumonia versus atelectasis. Nursing stated pt may have been drinking too fast this morning (w/ meds?) and became red/choked. She tolerated meds after when given w/ a thickened liquid. Pt denied any trouble swallowing at home prior to this admission. She currently denies any difficulty chewing or swallowing d/t the nose/upper lip bruising. She denied being hungry for anything to eat. Noted recent CXR: RIGHT basilar atelectasis and small RIGHT pleural effusion; grossly clear. Pt is tolerating her current diet per NSG report; meds in puree but crushed is easier  per NSG.       SLP Plan  All goals met     Recommendations  Diet recommendations: Dysphagia 3 (mechanical soft);Thin liquid (w/ some purees in diet for easier intake too) Liquids provided via: Cup;No straw (if coughing noted) Medication Administration: Crushed with puree (as needed) Supervision: Patient able to self feed;Staff to assist with self feeding;Intermittent supervision to cue for compensatory strategies Compensations: Minimize environmental distractions;Slow rate;Small sips/bites;Follow solids with liquid Postural Changes and/or Swallow Maneuvers: Upright 30-60 min after meal;Seated upright 90 degrees                General recommendations:  (dietician f/u) Oral Care Recommendations: Oral care BID;Staff/trained caregiver to provide oral care Follow up Recommendations: Skilled Nursing facility Plan: All goals met       Sigel, Sandy, CCC-SLP  Watson,Katherine 04/04/2016, 3:00 PM

## 2016-04-04 NOTE — NC FL2 (Signed)
  [place] MEDICAID FL2 LEVEL OF CARE SCREENING TOOL     IDENTIFICATION  Patient Name: Haley ChiquitoMarion Waters Birthdate: 04/28/1917 Sex: female Admission Date (Current Location): 04/02/2016  [facility] [place]   Facility and Address:  [facility], [address], [place], [place] [address]      Provider Number: [id number]  Attending Physician Name and Address:  Alford Highlandichard Wieting, MD  Relative Name and Phone Number:       Current Level of Care: Hospital Recommended Level of Care: Skilled Nursing Facility Prior Approval Number:    Date Approved/Denied:   PASRR Number:    Discharge Plan: SNF    Current Diagnoses: Patient Active Problem List   Diagnosis Date Noted  . Pressure ulcer 04/03/2016  . Pneumonia 04/02/2016    Orientation RESPIRATION BLADDER Height & Weight     Time, Situation, Self, Place  O2 (2 Liters) Continent Weight: 85 lb (38.6 kg) Height:  4\' 6"  (137.2 cm)  BEHAVIORAL SYMPTOMS/MOOD NEUROLOGICAL BOWEL NUTRITION STATUS   (None.)  (None.) Continent Diet (Soft Diet)  AMBULATORY STATUS COMMUNICATION OF NEEDS Skin   Extensive Assist Verbally PU Stage and Appropriate Care (Pressure Ulcer Stage 2 on Sacrum. Pressure Ulcer Stage 1 on Right Ankle. )                       Personal Care Assistance Level of Assistance  Bathing, Feeding, Dressing Bathing Assistance: Limited assistance Feeding assistance: Independent Dressing Assistance: Limited assistance     Functional Limitations Info  Sight, Hearing, Speech Sight Info: Adequate Hearing Info: Impaired Speech Info: Adequate    SPECIAL CARE FACTORS FREQUENCY  OT (By licensed OT), PT (By licensed PT)     PT Frequency:  (5) OT Frequency:  (5)            Contractures      Additional Factors Info  Code Status, Allergies Code Status Info:  (DNR) Allergies Info:  (No Known Allergies )           Current Medications (04/04/2016):  This is the current  hospital active medication list Current Facility-Administered Medications  Medication Dose Route Frequency Provider Last Rate Last Dose  . acetaminophen (TYLENOL) tablet 650 mg  650 mg Oral Q6H PRN Enedina FinnerSona Patel, MD       Or  . acetaminophen (TYLENOL) suppository 650 mg  650 mg Rectal Q6H PRN Enedina FinnerSona Patel, MD      . azithromycin (ZITHROMAX) 200 MG/5ML suspension 250 mg  250 mg Oral Daily Enedina FinnerSona Patel, MD   250 mg at 04/03/16 1845  . cefUROXime (CEFTIN) tablet 500 mg  500 mg Oral Daily Enid Baasadhika Kalisetti, MD   500 mg at 04/04/16 1010  . enoxaparin (LOVENOX) injection 30 mg  30 mg Subcutaneous QHS Enedina FinnerSona Patel, MD   30 mg at 04/03/16 2110  . ondansetron (ZOFRAN) tablet 4 mg  4 mg Oral Q6H PRN Enedina FinnerSona Patel, MD       Or  . ondansetron (ZOFRAN) injection 4 mg  4 mg Intravenous Q6H PRN Enedina FinnerSona Patel, MD      . sodium chloride flush (NS) 0.9 % injection 3 mL  3 mL Intravenous Q12H Enedina FinnerSona Patel, MD   3 mL at 04/03/16 2110     Discharge Medications: Please see discharge summary for a list of discharge medications.  Relevant Imaging Results:  Relevant Lab Results:   Additional Information  (SSN: [id number])  Angle Dirusso, Darleen CrockerBailey M, LCSW

## 2016-04-05 MED ORDER — METOPROLOL SUCCINATE ER 25 MG PO TB24: 25.0000 mg | ORAL_TABLET | Freq: Every day | ORAL | Status: DC

## 2016-04-05 NOTE — Progress Notes (Addendum)
Patient ID: Haley Waters, female   DOB: 12-28-1916, 82 y.o.   MRN: [id number]   [facility] PROGRESS NOTE  Ethne Jeon NWG:[id number] DOB: 1917/05/09 DOA: 04/02/2016 PCP: No PCP Per Patient  HPI/Subjective: Patient feels okay. Offers no complaints.  Objective: Vitals:   04/05/16 0446 04/05/16 0828  BP: 138/71 (!) 133/55  Pulse: (!) 116 (!) 101  Resp: 19 15  Temp: 97.6 F (36.4 C) 97.4 F (36.3 C)    Filed Weights   04/02/16 1419  Weight: 38.6 kg (85 lb)    ROS: Review of Systems  Constitutional: Negative for chills and fever.  Eyes: Negative for blurred vision.  Respiratory: Negative for cough and shortness of breath.   Cardiovascular: Negative for chest pain.  Gastrointestinal: Negative for abdominal pain, constipation, diarrhea, nausea and vomiting.  Genitourinary: Negative for dysuria.  Musculoskeletal: Negative for joint pain.  Neurological: Negative for dizziness and headaches.   Exam: Physical Exam  HENT:  Nose: No mucosal edema.  Mouth/Throat: No oropharyngeal exudate or posterior oropharyngeal edema.  Eyes: Conjunctivae, EOM and lids are normal. Pupils are equal, round, and reactive to light.  Neck: No JVD present. Carotid bruit is not present. No edema present. No thyroid mass and no thyromegaly present.  Cardiovascular: S1 normal and S2 normal.  Exam reveals no gallop.   Murmur heard.  Systolic murmur is present with a grade of 2/6  Pulses:      Dorsalis pedis pulses are 2+ on the right side, and 2+ on the left side.  Respiratory: No respiratory distress. She has decreased breath sounds in the right middle field, the right lower field, the left middle field and the left lower field. She has no wheezes. She has no rhonchi. She has no rales.  GI: Soft. Bowel sounds are normal. There is no tenderness.  Musculoskeletal:       Right ankle: She exhibits no swelling.       Left ankle: She exhibits swelling.  Lymphadenopathy:    She has no cervical  adenopathy.  Neurological: She is alert. No cranial nerve deficit.  Skin: Skin is warm. No rash noted. Nails show no clubbing.  Bruising around bilateral eyes. Sutures seen on the nose with bruising. Bruising upper lip. Bruising bilateral arms  Psychiatric: She has a normal mood and affect.      Data Reviewed: Basic Metabolic Panel:  Recent Labs Lab 04/02/16 1429 04/02/16 1434  NA 140  --   K 4.7  --   CL 103  --   CO2 29  --   GLUCOSE 137*  --   BUN 21*  --   CREATININE 0.70  --   CALCIUM 8.5*  --   MG  --  2.1   CBC:  Recent Labs Lab 04/02/16 1429 04/04/16 0347  WBC 13.2* 9.8  HGB 14.6 12.6  HCT 42.0 35.5  MCV 92.7 93.3  PLT 205 141*   Cardiac Enzymes:  Recent Labs Lab 04/02/16 1434  TROPONINI 0.04*     Recent Results (from the past 240 hour(s))  Blood culture (routine x 2)     Status: None (Preliminary result)   Collection Time: 04/02/16  6:25 PM  Result Value Ref Range Status   Specimen Description BLOOD LEFT FA  Final   Special Requests   Final    BOTTLES DRAWN AEROBIC AND ANAEROBIC AER 5CC ANA 5CC   Culture NO GROWTH 3 DAYS  Final   Report Status PENDING  Incomplete  Blood culture (routine x  2)     Status: Abnormal (Preliminary result)   Collection Time: 04/02/16  6:25 PM  Result Value Ref Range Status   Specimen Description BLOOD RIGHT AC  Final   Special Requests   Final    BOTTLES DRAWN AEROBIC AND ANAEROBIC AER 7CC ANA10CC   Culture  Setup Time   Final    GRAM POSITIVE COCCI AEROBIC BOTTLE ONLY CRITICAL RESULT CALLED TO, READ BACK BY AND VERIFIED WITH: JASON ROBBINS ON 04/03/16 AT 2202 BY TLB CONFIRMED BY MSS/TLB Performed at [facility]    Culture STAPHYLOCOCCUS SPECIES (COAGULASE NEGATIVE) (A)  Final   Report Status PENDING  Incomplete  Blood Culture ID Panel (Reflexed)     Status: Abnormal   Collection Time: 04/02/16  6:25 PM  Result Value Ref Range Status   Enterococcus species NOT DETECTED NOT DETECTED Final   Vancomycin  resistance NOT DETECTED NOT DETECTED Final   Listeria monocytogenes NOT DETECTED NOT DETECTED Final   Staphylococcus species DETECTED (A) NOT DETECTED Final    Comment: CRITICAL RESULT CALLED TO, READ BACK BY AND VERIFIED WITH: JASON ROBBINS ON 04/03/16 AT 2202 BY TLB    Staphylococcus aureus NOT DETECTED NOT DETECTED Final   Methicillin resistance NOT DETECTED NOT DETECTED Final   Streptococcus species NOT DETECTED NOT DETECTED Final   Streptococcus agalactiae NOT DETECTED NOT DETECTED Final   Streptococcus pneumoniae NOT DETECTED NOT DETECTED Final   Streptococcus pyogenes NOT DETECTED NOT DETECTED Final   Acinetobacter baumannii NOT DETECTED NOT DETECTED Final   Enterobacteriaceae species NOT DETECTED NOT DETECTED Final   Enterobacter cloacae complex NOT DETECTED NOT DETECTED Final   Escherichia coli NOT DETECTED NOT DETECTED Final   Klebsiella oxytoca NOT DETECTED NOT DETECTED Final   Klebsiella pneumoniae NOT DETECTED NOT DETECTED Final   Proteus species NOT DETECTED NOT DETECTED Final   Serratia marcescens NOT DETECTED NOT DETECTED Final   Carbapenem resistance NOT DETECTED NOT DETECTED Final   Haemophilus influenzae NOT DETECTED NOT DETECTED Final   Neisseria meningitidis NOT DETECTED NOT DETECTED Final   Pseudomonas aeruginosa NOT DETECTED NOT DETECTED Final   Candida albicans NOT DETECTED NOT DETECTED Final   Candida glabrata NOT DETECTED NOT DETECTED Final   Candida krusei NOT DETECTED NOT DETECTED Final   Candida parapsilosis NOT DETECTED NOT DETECTED Final   Candida tropicalis NOT DETECTED NOT DETECTED Final     Studies: No results found.  Scheduled Meds: . azithromycin  250 mg Oral Daily  . cefUROXime  500 mg Oral Daily  . enoxaparin (LOVENOX) injection  30 mg Subcutaneous QHS  . sodium chloride flush  3 mL Intravenous Q12H    Assessment/Plan:  1. Community-acquired pneumonia in the right lower lobe with leukocytosis. Patient on Ceftin and Zithromax. 2. Acute  hypoxic respiratory failure. Patient able to come off oxygen at rest. At rehabilitation facility will need to check pulse ox with ambulation. If she drops down less than 88% with ambulation then she would be a candidate to wear oxygen 2 L with ambulation 3. Frequent falls. Clubfeet. Sutures on the nose need to be taken out in 7 days 4. Weakness. Physical therapy at rehabilitation. Patient will go to rehabilitation today 5. Elevated troponin. Demand ischemia. No further workup 6. Positive blood culture is a skin contaminant. 7. Tachycardia. I was considering starting some low dose Toprol but I'd rather hold off because of her age. If her heart rate goes very fast with ambulation can consider starting low-dose Toprol over at the rehabilitation  facility.  Code Status:     Code Status Orders        Start     Ordered   04/02/16 2137  Do not attempt resuscitation (DNR)  Continuous    Question Answer Comment  In the event of cardiac or respiratory ARREST Do not call a "code blue"   In the event of cardiac or respiratory ARREST Do not perform Intubation, CPR, defibrillation or ACLS   In the event of cardiac or respiratory ARREST Use medication by any route, position, wound care, and other measures to relive pain and suffering. May use oxygen, suction and manual treatment of airway obstruction as needed for comfort.      04/02/16 2136    Code Status History    Date Active Date Inactive Code Status Order ID Comments User Context   This patient has a current code status but no historical code status.    Advance Directive Documentation   Flowsheet Row Most Recent Value  Type of Advance Directive  Healthcare Power of Attorney, Living will  Pre-existing out of facility DNR order (yellow form or pink MOST form)  No data  "MOST" Form in Place?  No data     Family Communication: Spoke with daughter At the bedside Disposition Plan: Rehabilitation today  Antibiotics:  Ceftin  Zithromax  Time  spent: 35 minutes  Alford HighlandWIETING, Aqueelah Cotrell  [employer]

## 2016-04-05 NOTE — Plan of Care (Signed)
Problem: Bowel/Gastric: Goal: Will not experience complications related to bowel motility Outcome: Adequate for Discharge This writer called report to [facility], and then notified ems of need to transport. Pt has been dc'd via ems to [facility], pt in no distress, daughter followed. D/c packet sent to [facility] with pt.

## 2016-04-05 NOTE — Clinical Social Work Placement (Signed)
   CLINICAL SOCIAL WORK PLACEMENT  NOTE  Date:  04/05/2016  Patient Details  Name: Corie ChiquitoMarion Jaskiewicz MRN: [id number] Date of Birth: 08/23/1916  Clinical Social Work is seeking post-discharge placement for this patient at the Skilled  Nursing Facility level of care (*CSW will initial, date and re-position this form in  chart as items are completed):  Yes   Patient/family provided with [facility] Clinical Social Work Department's list of facilities offering this level of care within the geographic area requested by the patient (or if unable, by the patient's family).  Yes   Patient/family informed of their freedom to choose among providers that offer the needed level of care, that participate in Medicare, Medicaid or managed care program needed by the patient, have an available bed and are willing to accept the patient.  Yes   Patient/family informed of [facility] ownership interest [facility] and [facility], as well as of the fact that they are under no obligation to receive care at these facilities.  PASRR submitted to EDS on 04/03/16     PASRR number received on 04/03/16     Existing PASRR number confirmed on       FL2 transmitted to all facilities in geographic area requested by pt/family on 04/03/16     FL2 transmitted to all facilities within larger geographic area on       Patient informed that his/her managed care company has contracts with or will negotiate with certain facilities, including the following:        Yes   Patient/family informed of bed offers received.  Patient chooses bed at  ([facility])     Physician recommends and patient chooses bed at  ([facility])    Patient to be transferred to  ([facility]) on 04/05/16.  Patient to be transferred to facility by non-emergent EMS     Patient family notified on 04/05/16 of transfer.  Name of family member notified:  Loyal GamblerGail Scearce     PHYSICIAN       Additional Comment:     _______________________________________________ Judi CongKaren M Anakin Varkey, LCSW 04/05/2016, 10:39 AM

## 2016-04-05 NOTE — Progress Notes (Signed)
Shift assessment completed at 0830. Daughter at bedside, pt is awake, alert to self and place, situation, is HOH.skin is warm and dry. Pt has sutures evident across the bridge of her nose, nos is edematous, upper lip is purplish in color and edematous, pt is able to speak clearly. Pt has various other areas of bruising to her arms and face that daughter stated is due to a fall in the pantry. Pt's o2 is turned off, sat is 89% on room air, lungs are clear to upper lobes, decreased to bilat bases, respriations are shallow. S1S2 heard, abdomen is soft, bs heard. Ppp, no edema noted. Pt has no IV access, and denied pain. Dr. has been in on rounds and intends to d/c pt today to [facility], with O2 prn for use there, has encouraged pt to use respirex. Srx2, call bell in reach.

## 2016-04-05 NOTE — Clinical Social Work Note (Signed)
Patient to dc to [facility] via non emergent EMS due to safety because of weakness stemming from community acquired pneumonia. Patient's daughter and facility are aware. CSW will con't to follow pending any additional dc needs.  Argentina PonderKaren Martha Betha Shadix, MSW, LCSW-A [phone]

## 2016-04-06 LAB — CULTURE, BLOOD (ROUTINE X 2)

## 2016-04-07 LAB — CULTURE, BLOOD (ROUTINE X 2): CULTURE: NO GROWTH

## 2017-03-07 ENCOUNTER — Encounter: Payer: Self-pay | Admitting: Emergency Medicine

## 2017-03-07 ENCOUNTER — Emergency Department
Admission: EM | Admit: 2017-03-07 | Discharge: 2017-03-07 | Disposition: A | Discharge status: Home / Self Care | Payer: Medicare Other | Attending: Emergency Medicine | Admitting: Emergency Medicine

## 2017-03-07 ENCOUNTER — Emergency Department: Payer: Medicare Other

## 2017-03-07 DIAGNOSIS — W01198A Fall on same level from slipping, tripping and stumbling with subsequent striking against other object, initial encounter: Secondary | ICD-10-CM | POA: Insufficient documentation

## 2017-03-07 DIAGNOSIS — Z79899 Other long term (current) drug therapy: Secondary | ICD-10-CM | POA: Diagnosis not present

## 2017-03-07 DIAGNOSIS — S0993XA Unspecified injury of face, initial encounter: Secondary | ICD-10-CM | POA: Diagnosis present

## 2017-03-07 DIAGNOSIS — Y998 Other external cause status: Secondary | ICD-10-CM | POA: Insufficient documentation

## 2017-03-07 DIAGNOSIS — S0083XA Contusion of other part of head, initial encounter: Secondary | ICD-10-CM | POA: Insufficient documentation

## 2017-03-07 DIAGNOSIS — Y9301 Activity, walking, marching and hiking: Secondary | ICD-10-CM | POA: Insufficient documentation

## 2017-03-07 DIAGNOSIS — Y929 Unspecified place or not applicable: Secondary | ICD-10-CM | POA: Insufficient documentation

## 2017-03-07 NOTE — ED Triage Notes (Signed)
Patient tripped over excess oxygen tubing per caregiver, falling forward and striking her chin on her walker yesterday

## 2017-03-07 NOTE — ED Provider Notes (Signed)
[facility] Emergency Department Provider Note   ____________________________________________    I have reviewed the triage vital signs and the nursing notes.   HISTORY  Chief Complaint Fall     HPI Haley Waters is a 83 y.o. female who presents after a fall. Daughter reports that the patient had a mechanical witnessed fall yesterday, she tripped over her oxygen line and fell forward striking her chin on her walker. She has been doing well since then, she is ambulating without difficulty. She does have significant bruising to the chin. Today she complained to her daughter of some pain around the chin so they opted to come to the ED. No neuro deficits reported. No neck pain   History reviewed. No pertinent past medical history.  Patient Active Problem List   Diagnosis Date Noted  . Pressure ulcer 04/03/2016  . Pneumonia 04/02/2016    Past Surgical History:  Procedure Laterality Date  . CLUB FOOT RELEASE Bilateral   . EYE SURGERY      Prior to Admission medications   Medication Sig Start Date End Date Taking? Authorizing Provider  acetaminophen (TYLENOL) 325 MG tablet Take 2 tablets (650 mg total) by mouth every 6 (six) hours as needed for mild pain (or Fever >/= 101). 04/04/16   Alford Highland, MD  azithromycin (ZITHROMAX) 200 MG/5ML suspension Take 6.3 mLs (250 mg total) by mouth daily. 04/04/16   Alford Highland, MD  cefUROXime (CEFTIN) 500 MG tablet Take 1 tablet (500 mg total) by mouth daily. 04/05/16   Alford Highland, MD     Allergies Patient has no known allergies.  History reviewed. No pertinent family history.  Social History Social History  Substance Use Topics  . Smoking status: Never Smoker  . Smokeless tobacco: Never Used  . Alcohol use No    Review of Systems  Constitutional: No dizziness Eyes: No visual changes.  ENT: No neck pain Cardiovascular: Denies chest pain. Respiratory: no difficulty  breathing Gastrointestinal: no vomiting.   Genitourinary: no issues Musculoskeletal: Negative for back pain. Skin: bruising to the chin Neurological: Negative for focal weakness   ____________________________________________   PHYSICAL EXAM:  VITAL SIGNS: ED Triage Vitals  Enc Vitals Group     BP      Pulse      Resp      Temp      Temp src      SpO2      Weight      Height      Head Circumference      [facility] Flow      Pain Score      Pain Loc      Pain Edu?      Excl. in GC?     Constitutional: Alert. No acute distress.  Eyes: Conjunctivae are normal.  Head: bruising to the chin with tracking down the right side of the neck as well. Normal swallowing, pharynx is normal, jaw appears intact. Teeth are stable Nose: No congestion/rhinnorhea. Mouth/Throat: Mucous membranes are moist.   Neck:  , no vertebral tenderness to palpation Cardiovascular: Normal rate, regular rhythm.  Good peripheral circulation. Respiratory: Normal respiratory effort.  No retractions. Gastrointestinal: Soft and nontender. No distention.  No CVA tenderness. Genitourinary: deferred Musculoskeletal: moves all extremities without pain.  Warm and well perfused Neurologic:  No gross focal neurologic deficits are appreciated.  Skin:  Skin is warm, dry and intact. No rash noted.   ____________________________________________   LABS (all labs ordered are  listed, but only abnormal results are displayed)  Labs Reviewed - No data to display ____________________________________________  EKG  None ____________________________________________  RADIOLOGY  CT max face and cervical spine unremarkable, discussed lytic findings with daughter ____________________________________________   PROCEDURES  Procedure(s) performed: No    Critical Care performed:No ____________________________________________   INITIAL IMPRESSION / ASSESSMENT AND PLAN / ED COURSE  Pertinent labs & imaging results  that were available during my care of the patient were reviewed by me and considered in my medical decision making (see chart for details).  Patient overall well-appearing in no acute distress. She does have some significant bruising to the chin however she is eating and swallowing appropriately, no difficulty ambulating. We will image the face and the neck given the description of fall . No evidence of hyperextension injury  Imaging is reassuring, recommend supportive care, cryotherapy and analgesics   ____________________________________________   FINAL CLINICAL IMPRESSION(S) / ED DIAGNOSES  Final diagnoses:  Contusion of face, initial encounter      NEW MEDICATIONS STARTED DURING THIS VISIT:  Discharge Medication List as of 03/07/2017  5:22 PM       Note:  This document was prepared using Dragon voice recognition software and may include unintentional dictation errors.    Jene Every, MD 03/07/17 [phone]

## 2017-04-20 DEATH — deceased

## 2017-09-17 IMAGING — CT CT CERVICAL SPINE W/O CM
4 of 7 series · 14 of 33 positions shown, 16 images · non-contrast
Comparison: None.

CLINICAL DATA: Status post fall with lower jaw bruising.

EXAM:
CT CERVICAL SPINE WITHOUT CONTRAST
TECHNIQUE: Multidetector CT imaging of the cervical spine was performed without
intravenous contrast. Multiplanar CT image reconstructions were also
generated.

[Series 3: c spine soft · axial · 0.32mm/px · z∈[-232,-144]mm · 5 of 68 slices shown, 7 images]
[im 12/68  soft-tissue]
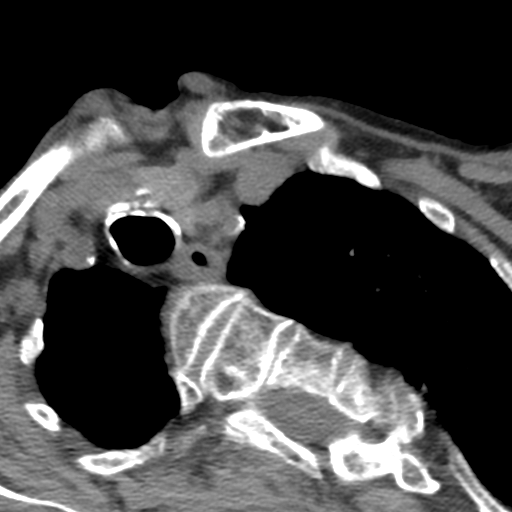
[im 12/68  bone]
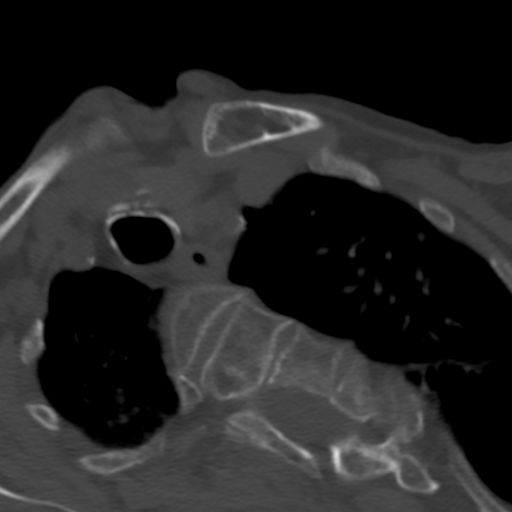
[im 23/68  bone]
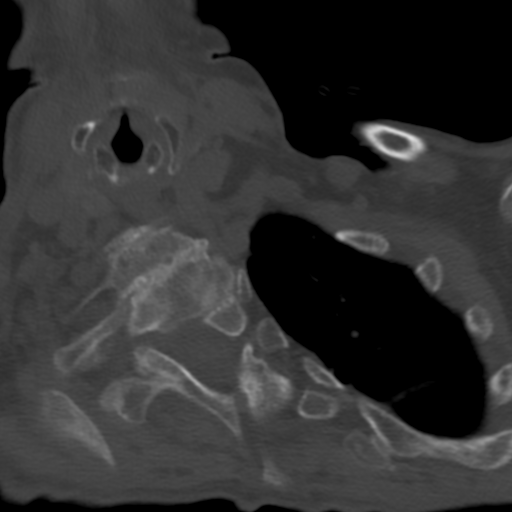
[im 34/68  bone]
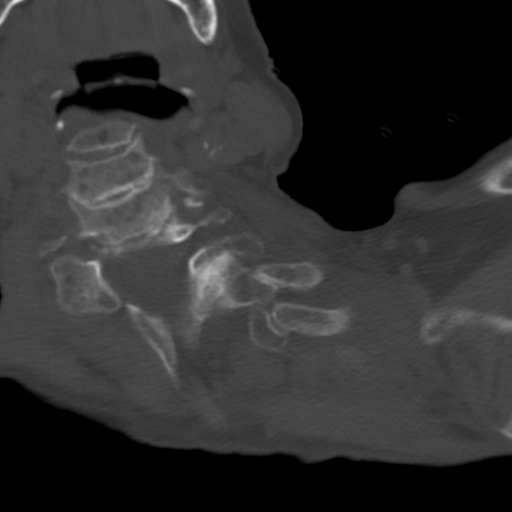
[im 45/68  bone]
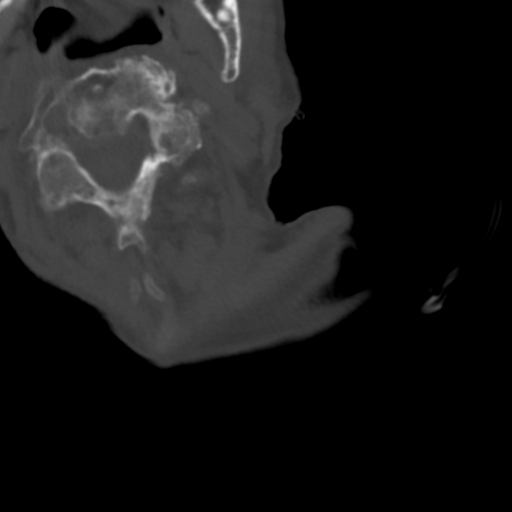
[im 56/68  soft-tissue]
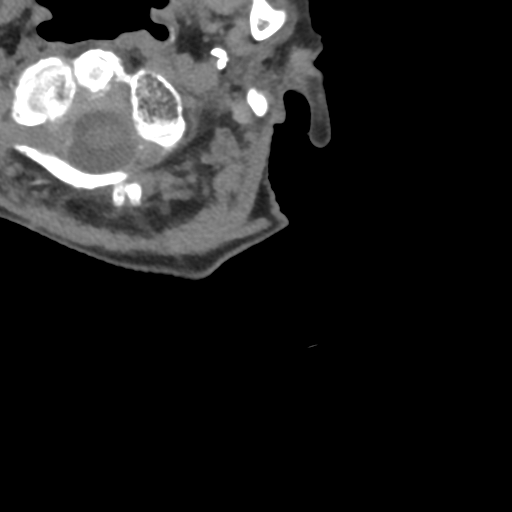
[im 56/68  bone]
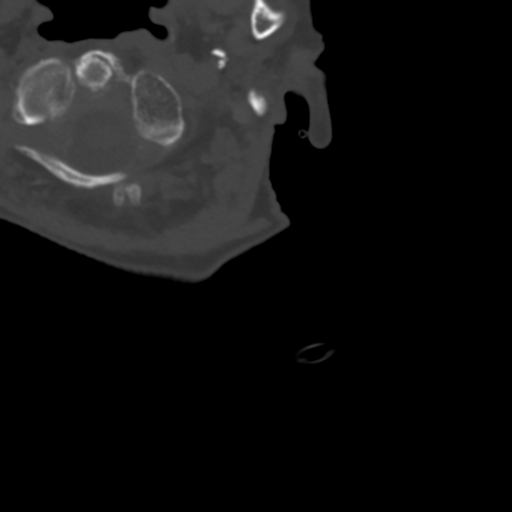

[Series 4: sagittal bone · sagittal · 0.20mm/px · 2 of 61 slices shown]
[im 21/61  bone]
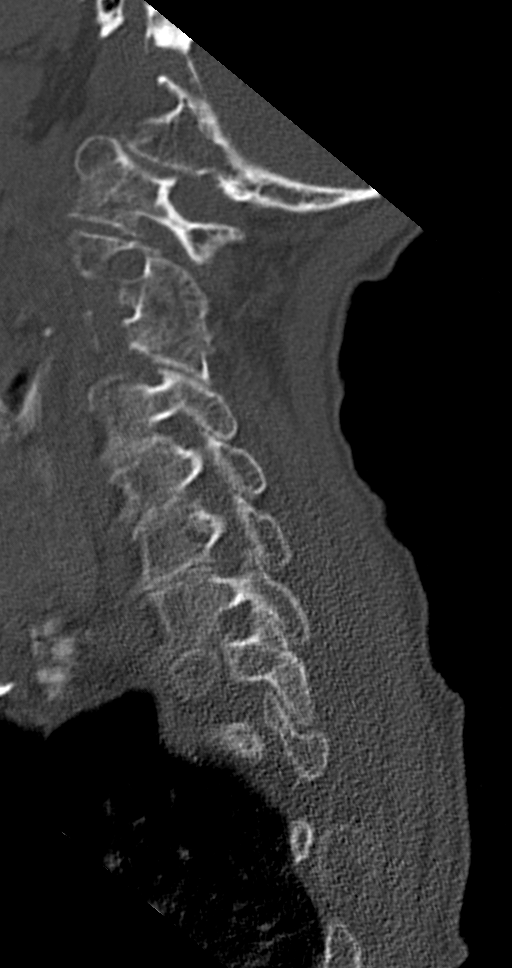
[im 41/61  bone]
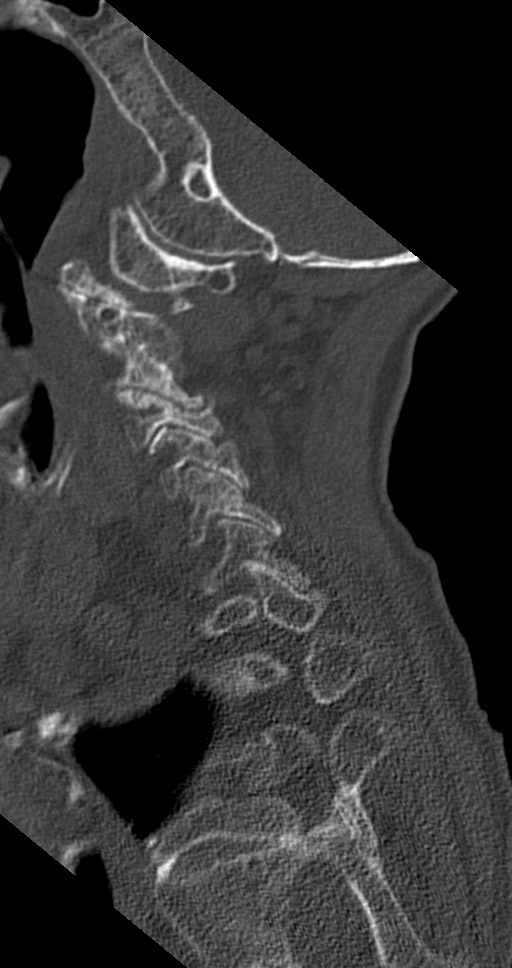

[Series 8: max soft · axial · 0.33mm/px · z∈[-214,-126]mm · 5 of 67 slices shown]
[im 12/67  soft-tissue]
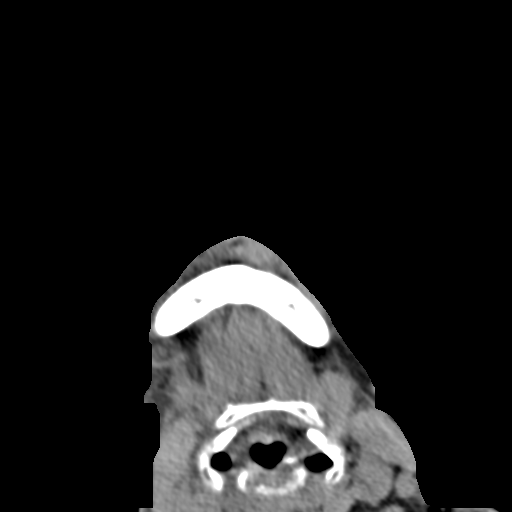
[im 23/67  soft-tissue]
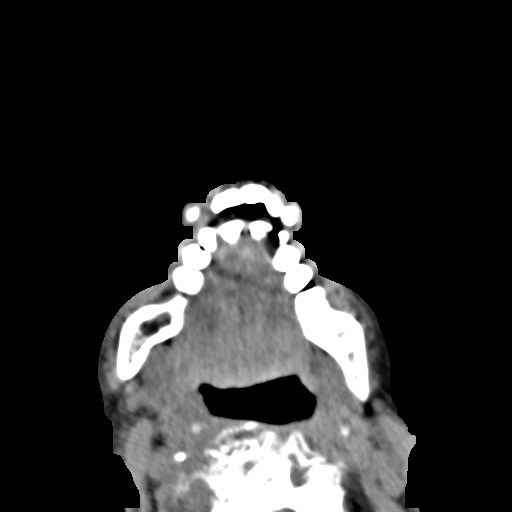
[im 34/67  soft-tissue]
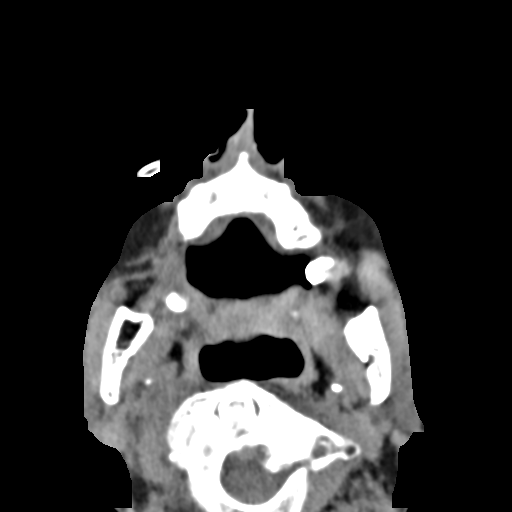
[im 45/67  soft-tissue]
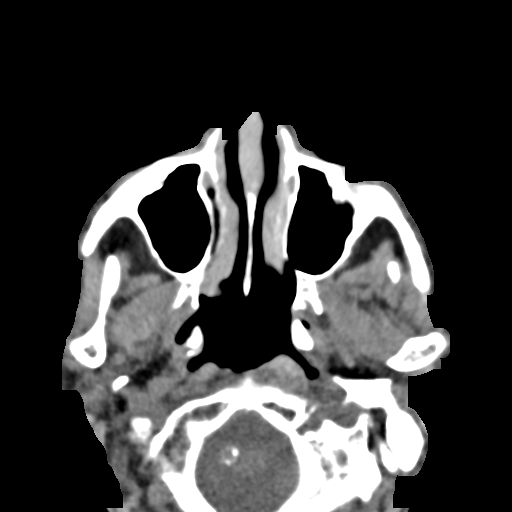
[im 56/67  soft-tissue]
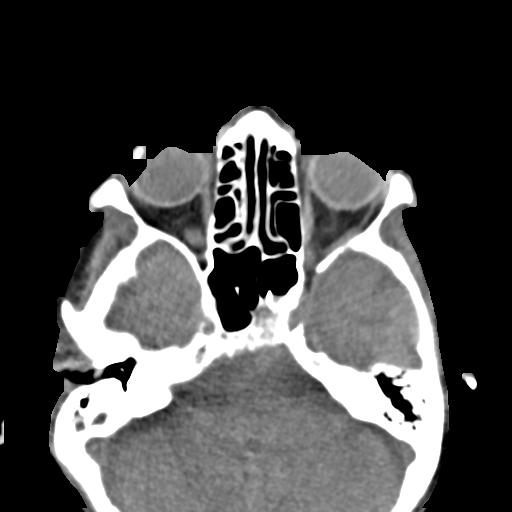

[Series 14: coronal bone · coronal · 0.31mm/px · 2 of 65 slices shown]
[im 30/65  bone]
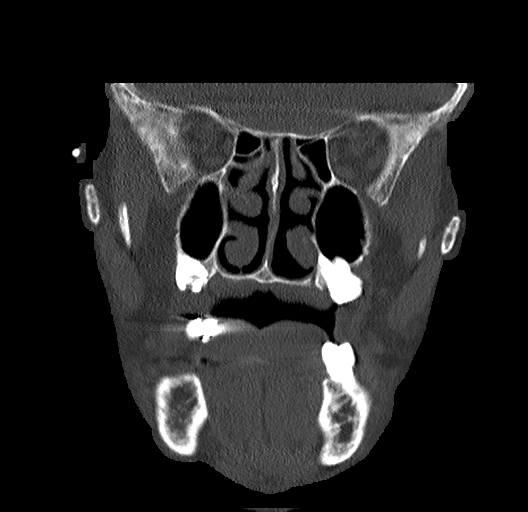
[im 59/65  bone]
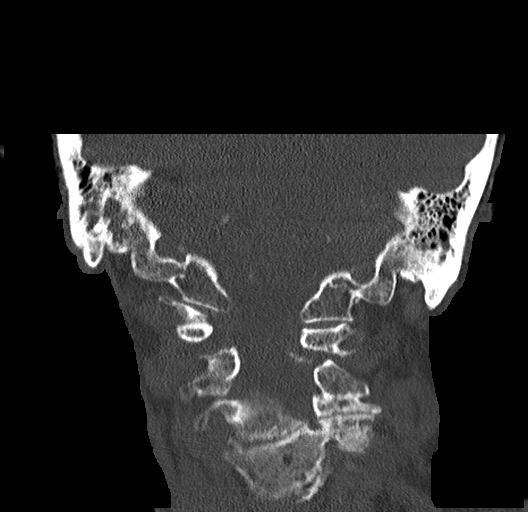

[14 of 33 positions shown; findings below may reference images not displayed]

FINDINGS: Alignment: Mild dextroconvex scoliosis. Likely degenerative mild
anterolisthesis of C3 on C4 and C5 on C6.

Skull base and vertebrae: No acute fracture. Well-circumscribed
lytic lesions are seen in multiple vertebral bodies, example C4 and
C5.

Soft tissues and spinal canal: No prevertebral fluid or swelling. No
visible canal hematoma.

Disc levels: Multilevel osteoarthritic changes with disc space
narrowing, endplate sclerosis, osteophyte formation and posterior
facet arthropathy. Congenital non fusion of the posterior processes
in the lower cervical spine.

Upper chest: Negative.

Other: Calcific atherosclerotic disease of the aorta. Multinodular
appearance of the thyroid gland.
IMPRESSION: No evidence of acute traumatic injury to the cervical spine.

Multilevel osteoarthritic changes with likely degenerative mild
anterolisthesis of C3 on C4 and C5 on C6.

Multiple well-circumscribed lytic lesions throughout the vertebral
bodies of the cervical spine. Does the patient have a history of
malignancy capable of producing lytic lesions?
